# Patient Record
Sex: Male | Born: 1975 | Race: White | Hispanic: No | Marital: Married | Smoking: Current every day smoker
Health system: Southern US, Community
[De-identification: ages and names within clinical notes are randomized; demographics above are authoritative.]

## PROBLEM LIST (undated history)

## (undated) DIAGNOSIS — M795 Residual foreign body in soft tissue: Secondary | ICD-10-CM

## (undated) DIAGNOSIS — Z9884 Bariatric surgery status: Secondary | ICD-10-CM

## (undated) DIAGNOSIS — F4329 Adjustment disorder with other symptoms: Secondary | ICD-10-CM

## (undated) DIAGNOSIS — E119 Type 2 diabetes mellitus without complications: Secondary | ICD-10-CM

## (undated) DIAGNOSIS — K912 Postsurgical malabsorption, not elsewhere classified: Secondary | ICD-10-CM

## (undated) DIAGNOSIS — F32A Depression, unspecified: Secondary | ICD-10-CM

## (undated) DIAGNOSIS — G629 Polyneuropathy, unspecified: Secondary | ICD-10-CM

## (undated) DIAGNOSIS — J449 Chronic obstructive pulmonary disease, unspecified: Secondary | ICD-10-CM

## (undated) DIAGNOSIS — Z903 Acquired absence of stomach [part of]: Secondary | ICD-10-CM

## (undated) DIAGNOSIS — J309 Allergic rhinitis, unspecified: Secondary | ICD-10-CM

## (undated) DIAGNOSIS — K219 Gastro-esophageal reflux disease without esophagitis: Secondary | ICD-10-CM

## (undated) HISTORY — PX: CHOLECYSTECTOMY: SHX55

## (undated) HISTORY — PX: BARIATRIC SURGERY: SHX1103

## (undated) HISTORY — PX: GASTRIC BYPASS: SHX52

---

## 2012-12-02 ENCOUNTER — Emergency Department: Admission: EM | Admit: 2012-12-02 | Discharge: 2012-12-02 | Payer: Self-pay

## 2013-08-24 ENCOUNTER — Ambulatory Visit (INDEPENDENT_AMBULATORY_CARE_PROVIDER_SITE_OTHER): Payer: BC Managed Care – PPO

## 2013-08-24 DIAGNOSIS — M546 Pain in thoracic spine: Secondary | ICD-10-CM

## 2013-08-24 DIAGNOSIS — R293 Abnormal posture: Secondary | ICD-10-CM

## 2013-08-24 DIAGNOSIS — M6281 Muscle weakness (generalized): Secondary | ICD-10-CM

## 2013-09-03 ENCOUNTER — Encounter: Payer: BC Managed Care – PPO | Admitting: Physical Therapy

## 2016-01-24 ENCOUNTER — Encounter: Payer: Self-pay | Admitting: Emergency Medicine

## 2016-01-24 ENCOUNTER — Emergency Department (INDEPENDENT_AMBULATORY_CARE_PROVIDER_SITE_OTHER)
Admission: EM | Admit: 2016-01-24 | Discharge: 2016-01-24 | Disposition: A | Discharge status: Home / Self Care | Payer: BLUE CROSS/BLUE SHIELD | Source: Home / Self Care | Attending: Family Medicine | Admitting: Family Medicine

## 2016-01-24 DIAGNOSIS — M545 Low back pain, unspecified: Secondary | ICD-10-CM

## 2016-01-24 HISTORY — DX: Type 2 diabetes mellitus without complications: E11.9

## 2016-01-24 MED ORDER — CYCLOBENZAPRINE HCL 10 MG PO TABS: 10.0000 mg | ORAL_TABLET | Freq: Two times a day (BID) | ORAL | Status: DC | PRN

## 2016-01-24 MED ORDER — HYDROCODONE-ACETAMINOPHEN 5-325 MG PO TABS: 2.0000 | ORAL_TABLET | ORAL | Status: DC | PRN

## 2016-01-24 NOTE — ED Notes (Signed)
Pt c/o mid to low back pain x 1 week.  No apparent injury.

## 2016-01-24 NOTE — ED Provider Notes (Signed)
CSN: [id number]     Arrival date & time 01/24/16  1357 History   None    Chief Complaint  Patient presents with  . Back Pain   (Consider location/radiation/quality/duration/timing/severity/associated sxs/prior Treatment) HPI  Christian Mathis is a 40 y.o. male presenting to UC with c/o Left sided mid-lower back pain that started about 1 week ago. Pain is aching and sore, waxes and wanes, worse with certain movements.  Pain is 8/10 at worst. He has hx of gastric bypass surgery so he cannot have ibuprofen but he has tried acetaminophen and naproxen with minimal relief. No known injury. He use to have back problems before he lost a lot of weight from the surgery but has not had any pain until this past week. No hx of back surgeries. Pain does not radiate. Denies pain or numbness in arms or legs.    Past Medical History  Diagnosis Date  . Diabetes mellitus without complication [facility](HCC)    Past Surgical History  Procedure Laterality Date  . Gastric bypass    . Cholecystectomy     No family history on file. Social History  Substance Use Topics  . Smoking status: Never Smoker   . Smokeless tobacco: None  . Alcohol Use: 1.8 oz/week    2 Cans of beer, 1 Glasses of wine per week    Review of Systems  Constitutional: Negative for fever and chills.  Genitourinary: Negative for dysuria, frequency, hematuria and flank pain.  Musculoskeletal: Positive for myalgias and back pain. Negative for joint swelling, arthralgias, gait problem, neck pain and neck stiffness.  Skin: Negative for color change and rash.  Neurological: Negative for weakness and numbness.    Allergies  Morphine and related  Home Medications   Prior to Admission medications   Medication Sig Start Date End Date Taking? Authorizing Provider  omeprazole (PRILOSEC) 10 MG capsule Take 10 mg by mouth daily.   Yes Historical Provider, MD  testosterone cypionate (DEPOTESTOTERONE CYPIONATE) 100 MG/ML injection Inject into the muscle  every 14 (fourteen) days. For IM use only   Yes Historical Provider, MD  cyclobenzaprine (FLEXERIL) 10 MG tablet Take 1 tablet (10 mg total) by mouth 2 (two) times daily as needed for muscle spasms. 01/24/16   Junius FinnerErin O'Malley, PA-C  HYDROcodone-acetaminophen (NORCO/VICODIN) 5-325 MG tablet Take 2 tablets by mouth every 4 (four) hours as needed. 01/24/16   Junius FinnerErin O'Malley, PA-C   Meds Ordered and Administered this Visit  Medications - No data to display  BP 115/79 mmHg  Pulse 97  Temp(Src) 98 F (36.7 C) (Oral)  Ht 5\' 9"  (1.753 m)  Wt 189 lb 8 oz (85.957 kg)  BMI 27.97 kg/m2  SpO2 97% No data found.   Physical Exam  Constitutional: He is oriented to person, place, and time. He appears well-developed and well-nourished.  HENT:  Head: Normocephalic and atraumatic.  Eyes: EOM are normal.  Neck: Normal range of motion. Neck supple.  No midline bone tenderness, no crepitus or step-offs.   Cardiovascular: Normal rate.   Pulmonary/Chest: Effort normal.  Musculoskeletal: Normal range of motion. He exhibits tenderness. He exhibits no edema.  No midline spinal tenderness.  Full ROM upper and lower extremities bilaterally. Tenderness to Left side lumbar muscles.   Neurological: He is alert and oriented to person, place, and time.  Skin: Skin is warm and dry. No rash noted. No erythema.  Psychiatric: He has a normal mood and affect. His behavior is normal.  Nursing note and vitals reviewed.  ED Course  Procedures (including critical care time)  Labs Review Labs Reviewed - No data to display  Imaging Review No results found.    MDM   1. Left-sided low back pain without sciatica    Pt c/o Left lower back pain. Tenderness to Left side lumbar muscles.  No known injury. No red flag symptoms. No indication for imaging at this time. Will treat as muscle strain.  Rx: Norco and flexeril.   Encouraged alternating ice and heat. Home care instructions with exercises provided. Encouraged f/u  with Sports Medicine in 1-2 weeks if not improving, sooner if worsening.  Patient verbalized understanding and agreement with treatment plan.     Junius Finnerrin O'Malley, PA-C 01/24/16 1452

## 2016-01-24 NOTE — Discharge Instructions (Signed)
°  Norco/Vicodin (hydrocodone-acetaminophen) is a narcotic pain medication, do not combine these medications with others containing tylenol. While taking, do not drink alcohol, drive, or perform any other activities that requires focus while taking these medications.  ° °Flexeril is a muscle relaxer and may cause drowsiness. Do not drink alcohol, drive, or operate heavy machinery while taking. ° °

## 2016-06-23 ENCOUNTER — Emergency Department (INDEPENDENT_AMBULATORY_CARE_PROVIDER_SITE_OTHER)
Admission: EM | Admit: 2016-06-23 | Discharge: 2016-06-23 | Disposition: A | Discharge status: Home / Self Care | Payer: BLUE CROSS/BLUE SHIELD | Source: Home / Self Care

## 2016-06-23 ENCOUNTER — Encounter: Payer: Self-pay | Admitting: Emergency Medicine

## 2016-06-23 DIAGNOSIS — M7712 Lateral epicondylitis, left elbow: Secondary | ICD-10-CM

## 2016-06-23 NOTE — ED Provider Notes (Signed)
[facility]    CSN: [id number] Arrival date & time: 06/23/16  1233     History   Chief Complaint Chief Complaint  Patient presents with  . Elbow Pain    HPI Christian Mathis is a 40 y.o. male.   Patient complains of 4 month history of pain in his left elbow that has become worse during the past week, occasionally radiating to his left thumb.  He recalls no injury.  He notes that Aleve helps somewhat.   The history is provided by the patient.    Past Medical History:  Diagnosis Date  . Diabetes mellitus without complication (HCC)     There are no active problems to display for this patient.   Past Surgical History:  Procedure Laterality Date  . CHOLECYSTECTOMY    . GASTRIC BYPASS         Home Medications    Prior to Admission medications   Medication Sig Start Date End Date Taking? Authorizing Provider  cyclobenzaprine (FLEXERIL) 10 MG tablet Take 1 tablet (10 mg total) by mouth 2 (two) times daily as needed for muscle spasms. 01/24/16   Junius FinnerErin O'Malley, PA-C  HYDROcodone-acetaminophen (NORCO/VICODIN) 5-325 MG tablet Take 2 tablets by mouth every 4 (four) hours as needed. 01/24/16   Junius FinnerErin O'Malley, PA-C  omeprazole (PRILOSEC) 10 MG capsule Take 10 mg by mouth daily.    Historical Provider, MD  testosterone cypionate (DEPOTESTOTERONE CYPIONATE) 100 MG/ML injection Inject into the muscle every 14 (fourteen) days. For IM use only    Historical Provider, MD    Family History No family history on file.  Social History Social History  Substance Use Topics  . Smoking status: Current Every Day Smoker    Years: 25.00    Types: Cigarettes  . Smokeless tobacco: Never Used     Comment: Is taking something to help quit but doesn't remember what  . Alcohol use 1.8 oz/week    1 Glasses of wine, 2 Cans of beer per week     Allergies   Morphine and related   Review of Systems Review of Systems  Musculoskeletal: Negative for joint swelling.       Left elbow  pain.  Skin: Negative.   All other systems reviewed and are negative.    Physical Exam Triage Vital Signs ED Triage Vitals  Enc Vitals Group     BP 06/23/16 1326 110/74     Pulse Rate 06/23/16 1326 84     Resp --      Temp 06/23/16 1326 97.9 F (36.6 C)     Temp Source 06/23/16 1326 Oral     SpO2 06/23/16 1326 97 %     Weight 06/23/16 1328 172 lb (78 kg)     Height 06/23/16 1328 5\' 9"  (1.753 m)     Head Circumference --      Peak Flow --      Pain Score 06/23/16 1331 7     Pain Loc --      Pain Edu? --      Excl. in GC? --    No data found.   Updated Vital Signs BP 110/74 (BP Location: Left Arm)   Pulse 84   Temp 97.9 F (36.6 C) (Oral)   Ht 5\' 9"  (1.753 m)   Wt 172 lb (78 kg)   SpO2 97%   BMI 25.40 kg/m   Visual Acuity Right Eye Distance:   Left Eye Distance:   Bilateral Distance:  Right Eye Near:   Left Eye Near:    Bilateral Near:     Physical Exam  Constitutional: He appears well-developed and well-nourished. No distress.  HENT:  Head: Atraumatic.  Eyes: Pupils are equal, round, and reactive to light.  Neck: Normal range of motion.  Cardiovascular: Normal rate.   Pulmonary/Chest: Effort normal.  Musculoskeletal: He exhibits no edema or deformity.       Left elbow: He exhibits normal range of motion and no swelling. Tenderness found. Lateral epicondyle tenderness noted.       Arms: There is distinct tenderness over the left lateral epicondyle.  Palpation there during resisted dorsiflexion and supination of the wrist recreates his pain.   Neurological: He is alert.  Nursing note and vitals reviewed.    UC Treatments / Results  Labs (all labs ordered are listed, but only abnormal results are displayed) Labs Reviewed - No data to display  EKG  EKG Interpretation None       Radiology No results found.  Procedures Procedures (including critical care time)  Medications Ordered in UC Medications - No data to display   Initial  Impression / Assessment and Plan / UC Course  I have reviewed the triage vital signs and the nursing notes.  Pertinent labs & imaging results that were available during my care of the patient were reviewed by me and considered in my medical decision making (see chart for details).  Clinical Course   AirCast elbow brace applied. Wear elbow brace daytime.  Take Aleve, 2 tabs twice daily with food for 5 to 7 days.  Apply ice pack for 20 to 30 minutes, 3 to 4 times daily  Continue until pain and swelling decrease.  Begin range of motion and stretching exercises as per instruction sheet. Followup with Dr. Rodney Langtonhomas Thekkekandam or Dr. Clementeen GrahamEvan Corey ([facility]) if not improving about two weeks.      Final Clinical Impressions(s) / UC Diagnoses   Final diagnoses:  Left lateral epicondylitis    New Prescriptions New Prescriptions   No medications on file     Christian HawStephen A Amen Dargis, MD 07/01/16 [phone]

## 2016-06-23 NOTE — ED Triage Notes (Signed)
Left elbow pain x 2 months worse the past week, sometimes radiates to thumb, denies trauma

## 2016-06-23 NOTE — Discharge Instructions (Signed)
Wear elbow brace daytime.  Take Aleve, 2 tabs twice daily with food for 5 to 7 days.  Apply ice pack for 20 to 30 minutes, 3 to 4 times daily  Continue until pain and swelling decrease.  Begin range of motion and stretching exercises as per instruction sheet.

## 2017-01-31 ENCOUNTER — Emergency Department (INDEPENDENT_AMBULATORY_CARE_PROVIDER_SITE_OTHER): Payer: BLUE CROSS/BLUE SHIELD

## 2017-01-31 ENCOUNTER — Encounter: Payer: Self-pay | Admitting: *Deleted

## 2017-01-31 ENCOUNTER — Emergency Department (INDEPENDENT_AMBULATORY_CARE_PROVIDER_SITE_OTHER)
Admission: EM | Admit: 2017-01-31 | Discharge: 2017-01-31 | Disposition: A | Discharge status: Home / Self Care | Payer: BLUE CROSS/BLUE SHIELD | Source: Home / Self Care | Attending: Family Medicine | Admitting: Family Medicine

## 2017-01-31 DIAGNOSIS — S62337A Displaced fracture of neck of fifth metacarpal bone, left hand, initial encounter for closed fracture: Secondary | ICD-10-CM

## 2017-01-31 DIAGNOSIS — S62337D Displaced fracture of neck of fifth metacarpal bone, left hand, subsequent encounter for fracture with routine healing: Secondary | ICD-10-CM | POA: Diagnosis not present

## 2017-01-31 DIAGNOSIS — X58XXXD Exposure to other specified factors, subsequent encounter: Secondary | ICD-10-CM | POA: Diagnosis not present

## 2017-01-31 DIAGNOSIS — S92352A Displaced fracture of fifth metatarsal bone, left foot, initial encounter for closed fracture: Secondary | ICD-10-CM

## 2017-01-31 DIAGNOSIS — X58XXXA Exposure to other specified factors, initial encounter: Secondary | ICD-10-CM

## 2017-01-31 DIAGNOSIS — S62339A Displaced fracture of neck of unspecified metacarpal bone, initial encounter for closed fracture: Secondary | ICD-10-CM | POA: Diagnosis not present

## 2017-01-31 MED ORDER — HYDROCODONE-ACETAMINOPHEN 10-325 MG PO TABS: 1.0000 | ORAL_TABLET | ORAL | 0 refills | Status: DC | PRN

## 2017-01-31 NOTE — Assessment & Plan Note (Signed)
Closed reduction, ulnar gutter splint. Hydrocodone for pain. Return to see me next week for repeat x-rays.   I billed a fracture code for this encounter, all subsequent visits will be post-op checks in the global period.

## 2017-01-31 NOTE — Discharge Instructions (Signed)
°  Norco/Vicodin (hydrocodone-acetaminophen) is a narcotic pain medication, do not combine these medications with others containing tylenol. While taking, do not drink alcohol, drive, or perform any other activities that requires focus while taking these medications.  ° °

## 2017-01-31 NOTE — Consult Note (Signed)
   Subjective:    I'm seeing this patient as a consultation for:  Jerrel IvoryEric O'Malley PA-C  CC: Hand fracture  HPI: 3 days ago this 41 year old male punched a wall, he had immediate pain, swelling, deformity. He presented to urgent care, x-rays showed an angulated fifth metacarpal boxer's fracture and I was called for further evaluation and definitive treatment, pain is moderate, persistent, localized without radiation.  Past medical history:  Negative.  See flowsheet/record as well for more information.  Surgical history: Negative.  See flowsheet/record as well for more information.  Family history: Negative.  See flowsheet/record as well for more information.  Social history: Negative.  See flowsheet/record as well for more information.  Allergies, and medications have been entered into the medical record, reviewed, and no changes needed.   Review of Systems: No headache, visual changes, nausea, vomiting, diarrhea, constipation, dizziness, abdominal pain, skin rash, fevers, chills, night sweats, weight loss, swollen lymph nodes, body aches, joint swelling, muscle aches, chest pain, shortness of breath, mood changes, visual or auditory hallucinations.   Objective:   General: Well Developed, well nourished, and in no acute distress.  Neuro/Psych: Alert and oriented x3, extra-ocular muscles intact, able to move all 4 extremities, sensation grossly intact. Skin: Warm and dry, no rashes noted.  Respiratory: Not using accessory muscles, speaking in full sentences, trachea midline.  Cardiovascular: Pulses palpable, no extremity edema. Abdomen: Does not appear distended. Left hand: Swollen, bruised, tender at the neck of the fifth metacarpal. Visible deformity.  X-rays reviewed and show an angulated fifth metacarpal fracture, apex dorsal and ulnar.  Procedure:  Fracture Reduction   Risks, benefits, and alternatives explained and consent obtained. Time out conducted. Surface prepped with  alcohol. 5cc lidocaine infiltrated in a hematoma block. Adequate anesthesia ensured. Fracture reduction: I applied a volarly directed force to reduce the fracture. Ulnar gutter splint was then applied. Post reduction films obtained showed anatomic/near-anatomic alignment. Pt stable, aftercare and follow-up advised.  Impression and Recommendations:   This case required medical decision making of moderate complexity.  Boxer's fracture Closed reduction, ulnar gutter splint. Hydrocodone for pain. Return to see me next week for repeat x-rays.   I billed a fracture code for this encounter, all subsequent visits will be post-op checks in the global period.

## 2017-01-31 NOTE — ED Provider Notes (Signed)
CSN: [id number]     Arrival date & time 01/31/17  1258 History   First MD Initiated Contact with Patient 01/31/17 1310     Chief Complaint  Patient presents with  . Hand Injury   (Consider location/radiation/quality/duration/timing/severity/associated sxs/prior Treatment) HPI  Christian Mathis is a 41 y.o. male presenting to UC with c/o Left hand pain and swelling after punching a wall 3 days ago.  Pain is aching and sore, worse with movement and palpation.  Pain is worst over 5th metacarpal. No other injuries. Pt denies hitting another person's mouth. He is Left hand dominant. He [facility] at [facility] this morning.    Past Medical History:  Diagnosis Date  . Diabetes mellitus without complication [facility])    Past Surgical History:  Procedure Laterality Date  . CHOLECYSTECTOMY    . GASTRIC BYPASS     History reviewed. No pertinent family history. Social History  Substance Use Topics  . Smoking status: Current Every Day Smoker    Packs/day: 1.00    Years: 25.00    Types: Cigarettes  . Smokeless tobacco: Never Used     Comment: Is taking something to help quit but doesn't remember what  . Alcohol use 1.8 oz/week    1 Glasses of wine, 2 Cans of beer per week    Review of Systems  Musculoskeletal: Positive for arthralgias, joint swelling and myalgias.  Skin: Negative for color change and wound.  Neurological: Negative for weakness and numbness.    Allergies  Morphine and related  Home Medications   Prior to Admission medications   Medication Sig Start Date End Date Taking? Authorizing Provider  metFORMIN (GLUCOPHAGE) 500 MG tablet Take by mouth 2 (two) times daily with a meal.   Yes [provider]  HYDROcodone-acetaminophen (NORCO) 10-325 MG tablet Take 1 tablet by mouth every 4 (four) hours as needed. 01/31/17   Monica Becton, MD  omeprazole (PRILOSEC) 10 MG capsule Take 10 mg by mouth daily.    [provider]  testosterone cypionate (DEPOTESTOTERONE  CYPIONATE) 100 MG/ML injection Inject into the muscle every 14 (fourteen) days. For IM use only    [provider]   Meds Ordered and Administered this Visit  Medications - No data to display  BP 131/76 (BP Location: Left Arm)   Pulse 68   Temp 98.2 F (36.8 C) (Oral)   Resp 16   Ht 5\' 9"  (1.753 m)   Wt 169 lb (76.7 kg)   SpO2 99%   BMI 24.96 kg/m  No data found.   Physical Exam  Constitutional: He is oriented to person, place, and time. He appears well-developed and well-nourished. No distress.  HENT:  Head: Normocephalic and atraumatic.  Eyes: EOM are normal.  Neck: Normal range of motion.  Cardiovascular: Normal rate.   Pulmonary/Chest: Effort normal.  Musculoskeletal: Normal range of motion. He exhibits edema and tenderness.  Left hand: mild edema to dorsal lateral aspect. Tenderness over 5th MCP joint. Full ROM wrist and all fingers. Able to make a full fist.   Neurological: He is alert and oriented to person, place, and time.  Left hand: normal sensation in all fingers  Skin: Skin is warm and dry. Capillary refill takes less than 2 seconds. He is not diaphoretic.  Left hand: 2mm superficial abrasion with dried skin radial side of 5th MCP joint (chronic per pt) skin otherwise in tact. No ecchymosis or erythema.   Psychiatric: He has a normal mood and affect. His behavior  is normal.  Nursing note and vitals reviewed.   Urgent Care Course     Procedures (including critical care time)  Labs Review Labs Reviewed - No data to display  Imaging Review Dg Hand Complete Left  Result Date: 01/31/2017 CLINICAL DATA:  Reduction of the left fifth metacarpal fracture. EXAM: LEFT HAND - COMPLETE 3+ VIEW COMPARISON:  January 31, 2017 1:41 p.m. FINDINGS: Patient status post reduction of fracture of distal fifth metacarpal with interval significant decrease of previously noted ventral angulation. Minimal displacement without significant angulation is identified on the current  exam. Cystic structure in the scaphoid is unchanged. IMPRESSION: Status post reduction of fracture of metacarpal with minimal displacement and no significant angulation identified on current films. Electronically Signed   By: Sherian ReinWei-Chen  Lin M.D.   On: 01/31/2017 14:24   Dg Hand Complete Left  Result Date: 01/31/2017 CLINICAL DATA:  Punched a wall on Friday. Fifth metacarpal pain. Initial encounter. EXAM: LEFT HAND - COMPLETE 3+ VIEW COMPARISON:  None. FINDINGS: Fifth metacarpal neck fracture with ventral impaction causing approximately 50 degrees of angulation. No additional fracture. No dislocation. Incidental cystic structure in the scaphoid.  No articular erosions. IMPRESSION: Impacted fifth metacarpal neck fracture. Electronically Signed   By: Marnee SpringJonathon  Watts M.D.   On: 01/31/2017 13:28     MDM   1. Closed boxer's fracture, initial encounter   2. Closed displaced fracture of neck of fifth metacarpal bone of left hand, initial encounter     Left hand: closed, impacted fifth metacarpal neck fracture. Discussed treatment options and f/u with pt. Pt agreeable to have Dr. Benjamin Stainhekkekandam, Sports Medicine, come over today to reduce and splint hand fracture  See consult note by Dr. Benjamin Stainhekkekandam Fracture was reduced, pt placed in splint Rx: Norco  F/u with Sports medicine in 1 week.      Lurene Shadowhelps, Cameryn Chrisley O, PA-C 01/31/17 1430

## 2017-01-31 NOTE — ED Triage Notes (Signed)
Pt c/o LT hand pain after punching a wall 3 days ago. Last dose aleve at 0500 today.

## 2017-04-12 ENCOUNTER — Encounter: Payer: Self-pay | Admitting: *Deleted

## 2017-04-12 ENCOUNTER — Emergency Department (INDEPENDENT_AMBULATORY_CARE_PROVIDER_SITE_OTHER)
Admission: EM | Admit: 2017-04-12 | Discharge: 2017-04-12 | Disposition: A | Discharge status: Home / Self Care | Payer: BLUE CROSS/BLUE SHIELD | Source: Home / Self Care | Attending: Family Medicine | Admitting: Family Medicine

## 2017-04-12 DIAGNOSIS — M545 Low back pain, unspecified: Secondary | ICD-10-CM

## 2017-04-12 MED ORDER — KETOROLAC TROMETHAMINE 60 MG/2ML IM SOLN
60.0000 mg | Freq: Once | INTRAMUSCULAR | Status: AC
  Administered 2017-04-12: 60 mg via INTRAMUSCULAR

## 2017-04-12 MED ORDER — PREDNISONE 20 MG PO TABS: ORAL_TABLET | ORAL | 0 refills | Status: DC

## 2017-04-12 MED ORDER — METHYLPREDNISOLONE ACETATE 80 MG/ML IJ SUSP
80.0000 mg | Freq: Once | INTRAMUSCULAR | Status: AC
  Administered 2017-04-12: 80 mg via INTRAMUSCULAR

## 2017-04-12 MED ORDER — METHOCARBAMOL 500 MG PO TABS: 500.0000 mg | ORAL_TABLET | Freq: Two times a day (BID) | ORAL | 0 refills | Status: DC | PRN

## 2017-04-12 MED ORDER — MELOXICAM 15 MG PO TABS: 15.0000 mg | ORAL_TABLET | Freq: Every day | ORAL | 0 refills | Status: DC

## 2017-04-12 NOTE — ED Triage Notes (Signed)
Pt c/o LBP x 1 wk, worse x 1 day. Denies injury. Took Aleve earlier this week with some relief. Took Tramadol yesterday without relief.

## 2017-04-12 NOTE — Discharge Instructions (Signed)
°  You were given a shot of depomedrol (a steroid) today to help with inflammation and pain in your back.  You have been prescribed 5 days of prednisone, an oral steroid.  You may start this medication tomorrow with breakfast.    Meloxicam (Mobic) is an antiinflammatory to help with pain and inflammation.  Do not take ibuprofen, Advil, Aleve, or any other medications that contain NSAIDs while taking meloxicam as this may cause stomach upset or even ulcers if taken in large amounts for an extended period of time.   Robaxin (methocarbamol) is a muscle relaxer and may cause drowsiness. Do not drink alcohol, drive, or operate heavy machinery while taking.

## 2017-04-12 NOTE — ED Provider Notes (Signed)
[facility]    CSN: [id number] Arrival date & time: 04/12/17  4098     History   Chief Complaint Chief Complaint  Patient presents with  . Back Pain    HPI BRENDIN SITU is a 41 y.o. male.   HPI THOMES BURAK is a 41 y.o. male presenting to UC with c/o 1 week of gradually worsening midline lower back pain that worsened over the last 24 hours. Pain is aching and sore, worse with position changes. Denies known injury. No heavy lifting or falls.  He took Aleve earlier this week with some relief. He took Tramadol, prescribed by his PCP last week w/o relief.  Denies radiation of pain or numbness in groin or legs.  No change in bowel or bladder habits. Hx of lower back pain in the past but no surgeries.    Past Medical History:  Diagnosis Date  . Diabetes mellitus without complication [facility])     Patient Active Problem List   Diagnosis Date Noted  . Boxer's fracture 01/31/2017    Past Surgical History:  Procedure Laterality Date  . CHOLECYSTECTOMY    . GASTRIC BYPASS         Home Medications    Prior to Admission medications   Medication Sig Start Date End Date Taking? Authorizing Provider  metFORMIN (GLUCOPHAGE) 500 MG tablet Take by mouth 2 (two) times daily with a meal.   Yes [provider]  HYDROcodone-acetaminophen (NORCO) 10-325 MG tablet Take 1 tablet by mouth every 4 (four) hours as needed. 01/31/17   Monica Becton, MD  meloxicam (MOBIC) 15 MG tablet Take 1 tablet (15 mg total) by mouth daily. For at least 5-7 days, then daily as needed for pain 04/12/17   Lurene Shadow, PA-C  methocarbamol (ROBAXIN) 500 MG tablet Take 1 tablet (500 mg total) by mouth 2 (two) times daily as needed for muscle spasms. 04/12/17   Lurene Shadow, PA-C  omeprazole (PRILOSEC) 10 MG capsule Take 10 mg by mouth daily.    [provider]  predniSONE (DELTASONE) 20 MG tablet 3 tabs po day one, then 2 po daily x 4 days 04/12/17   Lurene Shadow, PA-C    testosterone cypionate (DEPOTESTOTERONE CYPIONATE) 100 MG/ML injection Inject into the muscle every 14 (fourteen) days. For IM use only    [provider]    Family History History reviewed. No pertinent family history.  Social History Social History  Substance Use Topics  . Smoking status: Current Every Day Smoker    Packs/day: 1.00    Years: 25.00    Types: Cigarettes  . Smokeless tobacco: Never Used     Comment: Is taking something to help quit but doesn't remember what  . Alcohol use 1.8 oz/week    1 Glasses of wine, 2 Cans of beer per week     Allergies   Morphine and related   Review of Systems Review of Systems  Constitutional: Negative for chills and fever.  Gastrointestinal: Negative for abdominal pain and nausea.  Genitourinary: Negative for dysuria, frequency and hematuria.  Musculoskeletal: Positive for back pain and myalgias. Negative for arthralgias.  Skin: Negative for color change and wound.     Physical Exam Triage Vital Signs ED Triage Vitals [04/12/17 1001]  Enc Vitals Group     BP 123/85     Pulse Rate 85     Resp 16     Temp 97.8 F (36.6 C)  Temp Source Oral     SpO2 100 %     Weight 171 lb (77.6 kg)     Height      Head Circumference      Peak Flow      Pain Score 9     Pain Loc      Pain Edu?      Excl. in GC?    No data found.   Updated Vital Signs BP 123/85 (BP Location: Left Arm)   Pulse 85   Temp 97.8 F (36.6 C) (Oral)   Resp 16   Wt 171 lb (77.6 kg)   SpO2 100%   BMI 25.25 kg/m   Visual Acuity Right Eye Distance:   Left Eye Distance:   Bilateral Distance:    Right Eye Near:   Left Eye Near:    Bilateral Near:     Physical Exam  Constitutional: He is oriented to person, place, and time. He appears well-developed and well-nourished. No distress.  HENT:  Head: Normocephalic and atraumatic.  Eyes: EOM are normal.  Neck: Normal range of motion.  Cardiovascular: Normal rate and regular rhythm.    Pulmonary/Chest: Effort normal and breath sounds normal. No respiratory distress.  Musculoskeletal: Normal range of motion. He exhibits tenderness. He exhibits no edema.  Tenderness to lower lumbar spine and SI joint, bilateral lumbar muscular tenderness.  Full ROM upper and lower extremities with 5/5 strength. Negative straight leg raise. Slow with position changes (lying to sitting) but normal gait.   Neurological: He is alert and oriented to person, place, and time.  Skin: Skin is warm and dry. Capillary refill takes less than 2 seconds. He is not diaphoretic.  Psychiatric: He has a normal mood and affect. His behavior is normal.  Nursing note and vitals reviewed.    UC Treatments / Results  Labs (all labs ordered are listed, but only abnormal results are displayed) Labs Reviewed - No data to display  EKG  EKG Interpretation None       Radiology No results found.  Procedures Procedures (including critical care time)  Medications Ordered in UC Medications  ketorolac (TORADOL) injection 60 mg (60 mg Intramuscular Given 04/12/17 1020)  methylPREDNISolone acetate (DEPO-MEDROL) injection 80 mg (80 mg Intramuscular Given 04/12/17 1020)     Initial Impression / Assessment and Plan / UC Course  I have reviewed the triage vital signs and the nursing notes.  Pertinent labs & imaging results that were available during my care of the patient were reviewed by me and considered in my medical decision making (see chart for details).     Pain likely due to muscle strain. No red flag symptoms. Hx of lower back pain in the past.  Will treat conservatively at this time. Encouraged f/u with PCP or Sports Medicine in 1 week if not improving.   Final Clinical Impressions(s) / UC Diagnoses   Final diagnoses:  Acute midline low back pain without sciatica    New Prescriptions Discharge Medication List as of 04/12/2017 10:21 AM    START taking these medications   Details   meloxicam (MOBIC) 15 MG tablet Take 1 tablet (15 mg total) by mouth daily. For at least 5-7 days, then daily as needed for pain, Starting Tue 04/12/2017, Normal    methocarbamol (ROBAXIN) 500 MG tablet Take 1 tablet (500 mg total) by mouth 2 (two) times daily as needed for muscle spasms., Starting Tue 04/12/2017, Normal    predniSONE (DELTASONE) 20 MG tablet 3 tabs  po day one, then 2 po daily x 4 days, Normal         Controlled Substance Prescriptions [place] Controlled Substance Registry consulted? Not Applicable   Rolla Plate 04/12/17 1201

## 2018-01-23 ENCOUNTER — Encounter: Payer: Self-pay | Admitting: Emergency Medicine

## 2018-01-23 ENCOUNTER — Emergency Department (INDEPENDENT_AMBULATORY_CARE_PROVIDER_SITE_OTHER)
Admission: EM | Admit: 2018-01-23 | Discharge: 2018-01-23 | Disposition: A | Discharge status: Home / Self Care | Payer: BLUE CROSS/BLUE SHIELD | Source: Home / Self Care

## 2018-01-23 DIAGNOSIS — N342 Other urethritis: Secondary | ICD-10-CM | POA: Diagnosis not present

## 2018-01-23 DIAGNOSIS — Z711 Person with feared health complaint in whom no diagnosis is made: Secondary | ICD-10-CM | POA: Diagnosis not present

## 2018-01-23 MED ORDER — AZITHROMYCIN 250 MG PO TABS: ORAL_TABLET | ORAL | 0 refills | Status: DC

## 2018-01-23 MED ORDER — CEFTRIAXONE SODIUM 250 MG IJ SOLR
250.0000 mg | Freq: Once | INTRAMUSCULAR | Status: AC
  Administered 2018-01-23: 250 mg via INTRAMUSCULAR

## 2018-01-23 NOTE — ED Triage Notes (Signed)
passcode is [id number]

## 2018-01-23 NOTE — ED Triage Notes (Signed)
Pt c/o sore on penis since Friday and d/c. He would like STD testing.

## 2018-01-23 NOTE — Discharge Instructions (Addendum)
Drink lots of fluids to keep urinating regularly.  No sex until everything is cleared up  No sex until partner has been treated  Return if needed

## 2018-01-23 NOTE — ED Provider Notes (Signed)
[facility]    CSN: [id number] Arrival date & time: 01/23/18  1040     History   Chief Complaint Chief Complaint  Patient presents with  . Penile Discharge    HPI Christian Mathis is a 42 y.o. male.   HPI Patient separated from his wife 1 year ago after she had been cheating on him for a year.  First he was tested, and later his wife was tested, and neither had STDs.  Since last September he has been with a another girl.  2 days ago he started getting some irritation of his penis.  He does have a history of kidney stones, so he thought it might be that.  However yesterday noticed pus coming from his penis and knew that it was an infection.  He came on in today to get checked.  He does not know if his girlfriend having any symptoms. Past Medical History:  Diagnosis Date  . Diabetes mellitus without complication [facility](HCC)     Patient Active Problem List   Diagnosis Date Noted  . Boxer's fracture 01/31/2017    Past Surgical History:  Procedure Laterality Date  . CHOLECYSTECTOMY    . GASTRIC BYPASS         Home Medications    Prior to Admission medications   Medication Sig Start Date End Date Taking? Authorizing Provider  azithromycin (ZITHROMAX) 250 MG tablet 4 pills orally at the same time today. 01/23/18   Peyton NajjarHopper, Moreen Piggott H, MD  HYDROcodone-acetaminophen (NORCO) 10-325 MG tablet Take 1 tablet by mouth every 4 (four) hours as needed. 01/31/17   Monica Bectonhekkekandam, Thomas J, MD  meloxicam (MOBIC) 15 MG tablet Take 1 tablet (15 mg total) by mouth daily. For at least 5-7 days, then daily as needed for pain 04/12/17   Lurene ShadowPhelps, Erin O, PA-C  metFORMIN (GLUCOPHAGE) 500 MG tablet Take by mouth 2 (two) times daily with a meal.    [provider]  methocarbamol (ROBAXIN) 500 MG tablet Take 1 tablet (500 mg total) by mouth 2 (two) times daily as needed for muscle spasms. 04/12/17   Lurene ShadowPhelps, Erin O, PA-C  omeprazole (PRILOSEC) 10 MG capsule Take 10 mg by mouth daily.    [provider]  predniSONE (DELTASONE) 20 MG tablet 3 tabs po day one, then 2 po daily x 4 days 04/12/17   Lurene ShadowPhelps, Erin O, PA-C  testosterone cypionate (DEPOTESTOTERONE CYPIONATE) 100 MG/ML injection Inject into the muscle every 14 (fourteen) days. For IM use only    [provider]    Family History History reviewed. No pertinent family history.  Social History Social History   Tobacco Use  . Smoking status: Current Every Day Smoker    Packs/day: 1.00    Years: 25.00    Pack years: 25.00    Types: Cigarettes  . Smokeless tobacco: Never Used  . Tobacco comment: Is taking something to help quit but doesn't remember what  Substance Use Topics  . Alcohol use: Yes    Alcohol/week: 1.8 oz    Types: 1 Glasses of wine, 2 Cans of beer per week  . Drug use: No     Allergies   Morphine and related   Review of Systems Review of Systems He has had a little fatigue over the last month but he attributes that to stress at work.  Physical Exam Triage Vital Signs ED Triage Vitals  Enc Vitals Group     BP 01/23/18 1124 121/80     Pulse  Rate 01/23/18 1124 82     Resp --      Temp 01/23/18 1124 98.2 F (36.8 C)     Temp Source 01/23/18 1124 Oral     SpO2 01/23/18 1124 99 %     Weight 01/23/18 1125 172 lb (78 kg)     Height --      Head Circumference --      Peak Flow --      Pain Score 01/23/18 1125 0     Pain Loc --      Pain Edu? --      Excl. in GC? --    No data found.  Updated Vital Signs BP 121/80 (BP Location: Right Arm)   Pulse 82   Temp 98.2 F (36.8 C) (Oral)   Wt 172 lb (78 kg)   SpO2 99%   BMI 25.40 kg/m   Visual Acuity Right Eye Distance:   Left Eye Distance:   Bilateral Distance:    Right Eye Near:   Left Eye Near:    Bilateral Near:     Physical Exam No major acute distress.  He has pus that can be expressed from his penis.  Otherwise normal male external genitalia.  UC Treatments / Results  Labs (all labs ordered are listed, but  only abnormal results are displayed) Labs Reviewed  C. TRACHOMATIS/N. GONORRHOEAE RNA  RPR  HIV ANTIBODY (ROUTINE TESTING)  HSV 1/2 AB (IGM), IFA W/RFLX TITER  HSV(HERPES SIMPLEX VRS) I + II AB-IGG    EKG None  Radiology No results found.  Procedures Procedures (including critical care time)  Medications Ordered in UC Medications  cefTRIAXone (ROCEPHIN) injection 250 mg (has no administration in time range)    Initial Impression / Assessment and Plan / UC Course  I have reviewed the triage vital signs and the nursing notes.  Pertinent labs & imaging results that were available during my care of the patient were reviewed by me and considered in my medical decision making (see chart for details).    Urethritis, presumably gonorrhea or chlamydia, labs are pending.  We will check HIV and RPR and HSV also. Final Clinical Impressions(s) / UC Diagnoses   Final diagnoses:  Concern about STD in male without diagnosis  Urethritis     Discharge Instructions     Drink lots of fluids to keep urinating regularly.  No sex until everything is cleared up  No sex until partner has been treated  Return if needed    ED Prescriptions    Medication Sig Dispense Auth. Provider   azithromycin (ZITHROMAX) 250 MG tablet 4 pills orally at the same time today. 4 tablet Peyton Najjar, MD     Controlled Substance Prescriptions [place] Controlled Substance Registry consulted? No   Peyton Najjar, MD 01/23/18 1243

## 2018-01-24 LAB — C. TRACHOMATIS/N. GONORRHOEAE RNA
C. trachomatis RNA, TMA: NOT DETECTED
N. gonorrhoeae RNA, TMA: NOT DETECTED

## 2018-01-24 LAB — RPR: RPR: NONREACTIVE

## 2018-01-24 LAB — HIV ANTIBODY (ROUTINE TESTING W REFLEX): HIV: NONREACTIVE

## 2018-01-25 LAB — HSV 1/2 AB (IGM), IFA W/RFLX TITER
HSV 1 IgM Screen: NEGATIVE
HSV 2 IgM Screen: NEGATIVE

## 2018-01-25 LAB — HSV(HERPES SIMPLEX VRS) I + II AB-IGG
HAV 1 IGG,TYPE SPECIFIC AB: <0.9 index
HSV 2 IGG,TYPE SPECIFIC AB: <0.9 index

## 2018-01-26 ENCOUNTER — Telehealth: Payer: Self-pay | Admitting: *Deleted

## 2018-01-26 NOTE — Telephone Encounter (Signed)
Password verified and lab results given. He reports that he is feeling better.

## 2018-02-02 ENCOUNTER — Encounter: Payer: Self-pay | Admitting: Radiology

## 2018-12-17 ENCOUNTER — Other Ambulatory Visit: Payer: Self-pay

## 2018-12-17 ENCOUNTER — Emergency Department (INDEPENDENT_AMBULATORY_CARE_PROVIDER_SITE_OTHER): Payer: 59

## 2018-12-17 ENCOUNTER — Emergency Department
Admission: EM | Admit: 2018-12-17 | Discharge: 2018-12-17 | Disposition: A | Discharge status: Home / Self Care | Payer: 59 | Source: Home / Self Care | Attending: Family Medicine | Admitting: Family Medicine

## 2018-12-17 ENCOUNTER — Encounter: Payer: Self-pay | Admitting: Emergency Medicine

## 2018-12-17 DIAGNOSIS — M25511 Pain in right shoulder: Secondary | ICD-10-CM

## 2018-12-17 DIAGNOSIS — M7551 Bursitis of right shoulder: Secondary | ICD-10-CM

## 2018-12-17 DIAGNOSIS — M7521 Bicipital tendinitis, right shoulder: Secondary | ICD-10-CM

## 2018-12-17 MED ORDER — HYDROCODONE-ACETAMINOPHEN 5-325 MG PO TABS: ORAL_TABLET | ORAL | 0 refills | Status: DC

## 2018-12-17 MED ORDER — PREDNISONE 20 MG PO TABS: ORAL_TABLET | ORAL | 0 refills | Status: DC

## 2018-12-17 NOTE — ED Provider Notes (Signed)
[facility]    CSN: [id number] Arrival date & time: 12/17/18  1538     History   Chief Complaint Chief Complaint  Patient presents with   Shoulder Pain    HPI Christian Mathis is a 43 y.o. male.   Patient compalins of pain in his right shoulder for about 6 months, but recalls no injury.  The pain has become worse during the past week, keeping him awake.  The history is provided by the patient.  Shoulder Pain  Location:  Shoulder Shoulder location:  R shoulder Injury: no   Pain details:    Quality:  Aching   Radiates to:  R upper arm   Severity:  Moderate   Onset quality:  Gradual   Duration:  6 months   Timing:  Constant   Progression:  Worsening Handedness:  Left-handed Prior injury to area:  No Relieved by:  Nothing Worsened by:  Movement Ineffective treatments:  None tried Associated symptoms: decreased range of motion   Associated symptoms: no back pain, no muscle weakness, no neck pain, no numbness, no stiffness, no swelling and no tingling     Past Medical History:  Diagnosis Date   Diabetes mellitus without complication [facility](HCC)     Patient Active Problem List   Diagnosis Date Noted   Boxer's fracture 01/31/2017    Past Surgical History:  Procedure Laterality Date   BARIATRIC SURGERY     CHOLECYSTECTOMY     GASTRIC BYPASS         Home Medications    Prior to Admission medications   Medication Sig Start Date End Date Taking? Authorizing Provider  azithromycin (ZITHROMAX) 250 MG tablet 4 pills orally at the same time today. 01/23/18   Peyton NajjarHopper, David H, MD  HYDROcodone-acetaminophen (NORCO/VICODIN) 5-325 MG tablet Take one by mouth at bedtime as needed for pain.  May repeat in 4 to 6 hr prn. 12/17/18   Lattie HawBeese, Mariafernanda Hendricksen A, MD  meloxicam (MOBIC) 15 MG tablet Take 1 tablet (15 mg total) by mouth daily. For at least 5-7 days, then daily as needed for pain 04/12/17   Lurene ShadowPhelps, Erin O, PA-C  metFORMIN (GLUCOPHAGE) 500 MG tablet Take by mouth 2  (two) times daily with a meal.    [provider]  methocarbamol (ROBAXIN) 500 MG tablet Take 1 tablet (500 mg total) by mouth 2 (two) times daily as needed for muscle spasms. 04/12/17   Lurene ShadowPhelps, Erin O, PA-C  omeprazole (PRILOSEC) 10 MG capsule Take 10 mg by mouth daily.    [provider]  predniSONE (DELTASONE) 20 MG tablet Take one tab by mouth twice daily for 4 days, then one daily. Take with food. 12/17/18   Lattie HawBeese, Scotty Pinder A, MD  testosterone cypionate (DEPOTESTOTERONE CYPIONATE) 100 MG/ML injection Inject into the muscle every 14 (fourteen) days. For IM use only    [provider]    Family History No family history on file.  Social History Social History   Tobacco Use   Smoking status: Current Every Day Smoker    Packs/day: 1.00    Years: 25.00    Pack years: 25.00    Types: Cigarettes   Smokeless tobacco: Never Used   Tobacco comment: Is taking something to help quit but doesn't remember what  Substance Use Topics   Alcohol use: Yes    Alcohol/week: 3.0 standard drinks    Types: 1 Glasses of wine, 2 Cans of beer per week   Drug use: No  Allergies   Morphine and related   Review of Systems Review of Systems  Musculoskeletal: Negative for back pain, neck pain and stiffness.  All other systems reviewed and are negative.    Physical Exam Triage Vital Signs ED Triage Vitals  Enc Vitals Group     BP 12/17/18 1600 115/81     Pulse Rate 12/17/18 1600 78     Resp 12/17/18 1600 16     Temp 12/17/18 1600 98.3 F (36.8 C)     Temp Source 12/17/18 1600 Oral     SpO2 12/17/18 1600 98 %     Weight 12/17/18 1602 164 lb (74.4 kg)     Height 12/17/18 1602 5\' 9"  (1.753 m)     Head Circumference --      Peak Flow --      Pain Score 12/17/18 1602 7     Pain Loc --      Pain Edu? --      Excl. in GC? --    No data found.  Updated Vital Signs BP 115/81 (BP Location: Right Arm)    Pulse 78    Temp 98.3 F (36.8 C) (Oral)    Resp 16     Ht 5\' 9"  (1.753 m)    Wt 74.4 kg    SpO2 98%    BMI 24.22 kg/m   Visual Acuity Right Eye Distance:   Left Eye Distance:   Bilateral Distance:    Right Eye Near:   Left Eye Near:    Bilateral Near:     Physical Exam Vitals signs and nursing note reviewed.  Constitutional:      General: He is not in acute distress. HENT:     Head: Normocephalic.     Mouth/Throat:     Pharynx: Oropharynx is clear.  Eyes:     Conjunctiva/sclera: Conjunctivae normal.     Pupils: Pupils are equal, round, and reactive to light.  Neck:     Musculoskeletal: Normal range of motion. No muscular tenderness.  Cardiovascular:     Heart sounds: Normal heart sounds.  Pulmonary:     Breath sounds: Normal breath sounds.  Musculoskeletal:     Right shoulder: He exhibits decreased range of motion and tenderness. He exhibits no bony tenderness, no swelling, no effusion, no crepitus, no deformity and normal strength.       Arms:     Comments:  Right shoulder has good internal/external rotation.  The patient has difficulty actively abducting above the horizontal.  He can achieve 30 degrees above horizontal with passive adduction.  Distinct tenderness over the insertion of biceps tendon and the subacromial bursa.  Empty can test negative.  Hawkins test negative.  Distal neurovascular function is intact.    Skin:    General: Skin is warm and dry.  Neurological:     Mental Status: He is alert.      UC Treatments / Results  Labs (all labs ordered are listed, but only abnormal results are displayed) Labs Reviewed - No data to display  EKG None  Radiology Dg Shoulder Right  Result Date: 12/17/2018 CLINICAL DATA:  RIGHT shoulder pain for 8 months.  No recent injury. EXAM: RIGHT SHOULDER - 2+ VIEW COMPARISON:  None. FINDINGS: There is no evidence of fracture or dislocation. There is no evidence of arthropathy or other focal bone abnormality. Soft tissues are unremarkable. IMPRESSION: Negative. Electronically  Signed   By: Bary Richard M.D.   On: 12/17/2018 16:18  Procedures Procedures (including critical care time)  Medications Ordered in UC Medications - No data to display  Initial Impression / Assessment and Plan / UC Course  I have reviewed the triage vital signs and the nursing notes.  Pertinent labs & imaging results that were available during my care of the patient were reviewed by me and considered in my medical decision making (see chart for details).    Patient has history of gastric bypass; will begin prednisone burst/taper to avoid use of NSAID. Rx for Lortab at bedtime (#10; no refill. He notes that he can take Lortab without adverse effect). Controlled Substance Prescriptions I have consulted the [place] Controlled Substances Registry for this patient, and feel the risk/benefit ratio today is favorable for proceeding with this prescription for a controlled substance.   Followup with Dr. Rodney Langton or Dr. Clementeen Graham ([facility]) if not improving about two weeks.    Final Clinical Impressions(s) / UC Diagnoses   Final diagnoses:  Biceps tendonitis on right  Bursitis of right shoulder     Discharge Instructions     Apply ice pack for 20 to 30 minutes, 3 to 4 times daily  Continue until pain decreases.  Begin range of motion and stretching exercises as tolerated.    ED Prescriptions    Medication Sig Dispense Auth. Provider   predniSONE (DELTASONE) 20 MG tablet Take one tab by mouth twice daily for 4 days, then one daily. Take with food. 12 tablet Lattie Haw, MD   HYDROcodone-acetaminophen (NORCO/VICODIN) 5-325 MG tablet Take one by mouth at bedtime as needed for pain.  May repeat in 4 to 6 hr prn. 10 tablet Lattie Haw, MD        Lattie Haw, MD 12/18/18 [phone]

## 2018-12-17 NOTE — Discharge Instructions (Addendum)
Apply ice pack for 20 to 30 minutes, 3 to 4 times daily  Continue until pain decreases.  °Begin range of motion and stretching exercises as tolerated. °

## 2018-12-17 NOTE — ED Triage Notes (Signed)
Patient reports right shoulder pain for about 6 months with no injury; worsened over past week and he cannot sleep at night due to intermittent pain. No OTC or rx pain  Med today. He has not travelled.

## 2019-01-10 ENCOUNTER — Encounter: Payer: Self-pay | Admitting: Sports Medicine

## 2019-01-10 ENCOUNTER — Ambulatory Visit (INDEPENDENT_AMBULATORY_CARE_PROVIDER_SITE_OTHER): Payer: 59 | Admitting: Sports Medicine

## 2019-01-10 DIAGNOSIS — M19011 Primary osteoarthritis, right shoulder: Secondary | ICD-10-CM | POA: Insufficient documentation

## 2019-01-10 DIAGNOSIS — M75101 Unspecified rotator cuff tear or rupture of right shoulder, not specified as traumatic: Secondary | ICD-10-CM | POA: Diagnosis not present

## 2019-01-10 NOTE — Assessment & Plan Note (Signed)
Subacromial injection. Rehab exercises given, return to see me in 6 weeks.

## 2019-01-10 NOTE — Progress Notes (Signed)
Subjective:    CC: Right shoulder pain  HPI:  This is a pleasant 43 year old male, for the past several months he is had pain in his right shoulder, localized over the deltoid with radiation down to the elbow, worse with overhead activities, abduction, waking him up from sleep at night.  No trauma, no radicular symptoms down to the hand or fingertips.  I reviewed the past medical history, family history, social history, surgical history, and allergies today and no changes were needed.  Please see the problem list section below in epic for further details.  Past Medical History: Past Medical History:  Diagnosis Date  . Diabetes mellitus without complication [facility])    Past Surgical History: Past Surgical History:  Procedure Laterality Date  . BARIATRIC SURGERY    . CHOLECYSTECTOMY    . GASTRIC BYPASS     Social History: Social History   Socioeconomic History  . Marital status: Married    Spouse name: Not on file  . Number of children: Not on file  . Years of education: Not on file  . Highest education level: Not on file  Occupational History  . Not on file  Social Needs  . Financial resource strain: Not on file  . Food insecurity    Worry: Not on file    Inability: Not on file  . Transportation needs    Medical: Not on file    Non-medical: Not on file  Tobacco Use  . Smoking status: Current Every Day Smoker    Packs/day: 1.00    Years: 25.00    Pack years: 25.00    Types: Cigarettes  . Smokeless tobacco: Never Used  . Tobacco comment: Is taking something to help quit but doesn't remember what  Substance and Sexual Activity  . Alcohol use: Yes    Alcohol/week: 3.0 standard drinks    Types: 1 Glasses of wine, 2 Cans of beer per week  . Drug use: No  . Sexual activity: Not on file  Lifestyle  . Physical activity    Days per week: Not on file    Minutes per session: Not on file  . Stress: Not on file  Relationships  . Social Herbalist on phone: Not  on file    Gets together: Not on file    Attends religious service: Not on file    Active member of club or organization: Not on file    Attends meetings of clubs or organizations: Not on file    Relationship status: Not on file  Other Topics Concern  . Not on file  Social History Narrative  . Not on file   Family History: No family history on file. Allergies: Allergies  Allergen Reactions  . Morphine And Related Other (See Comments)    Headache and "burning in throat"  Has had vicodin before w/o issues.   Medications: See med rec.  Review of Systems: No headache, visual changes, nausea, vomiting, diarrhea, constipation, dizziness, abdominal pain, skin rash, fevers, chills, night sweats, swollen lymph nodes, weight loss, chest pain, body aches, joint swelling, muscle aches, shortness of breath, mood changes, visual or auditory hallucinations.  Objective:    General: Well Developed, well nourished, and in no acute distress.  Neuro: Alert and oriented x3, extra-ocular muscles intact, sensation grossly intact.  HEENT: Normocephalic, atraumatic, pupils equal round reactive to light, neck supple, no masses, no lymphadenopathy, thyroid nonpalpable.  Skin: Warm and dry, no rashes noted.  Cardiac: Regular rate and rhythm,  no murmurs rubs or gallops.  Respiratory: Clear to auscultation bilaterally. Not using accessory muscles, speaking in full sentences.  Abdominal: Soft, nontender, nondistended, positive bowel sounds, no masses, no organomegaly.  Right shoulder: Inspection reveals no abnormalities, atrophy or asymmetry. Palpation is normal with no tenderness over AC joint or bicipital groove. ROM is full in all planes. Rotator cuff strength normal throughout. Positive Neer and Hawkin's tests, empty can. Speeds and Yergason's tests normal. No labral pathology noted with negative Obrien's, negative crank, negative clunk, and good stability. Normal scapular function observed. No painful  arc and no drop arm sign. No apprehension sign  Procedure: Real-time Ultrasound Guided injection of the right subacromial bursa Device: GE Logiq E  Verbal informed consent obtained.  Time-out conducted.  Noted no overlying erythema, induration, or other signs of local infection.  Skin prepped in a sterile fashion.  Local anesthesia: Topical Ethyl chloride.  With sterile technique and under real time ultrasound guidance:  Noted a small amount of bursal sided tearing of the supraspinatus, 25-gauge needle advanced into the bursa, I then injected 1 cc Kenalog 40, 1 cc light gain, 1 cc bupivacaine. Completed without difficulty  Pain immediately resolved suggesting accurate placement of the medication.  Advised to call if fevers/chills, erythema, induration, drainage, or persistent bleeding.  Images permanently stored and available for review in the ultrasound unit.  Impression: Technically successful ultrasound guided injection.  Impression and Recommendations:    The patient was counselled, risk factors were discussed, anticipatory guidance given.  Rotator cuff syndrome, right Subacromial injection. Rehab exercises given, return to see me in 6 weeks.   ___________________________________________ Ihor Austinhomas J. Benjamin Stainhekkekandam, M.D., ABFM., CAQSM. Primary Care and Sports Medicine [facility]  Adjunct Professor of Family Medicine  [employer]

## 2019-01-29 ENCOUNTER — Ambulatory Visit: Payer: 59 | Admitting: Sports Medicine

## 2019-01-29 ENCOUNTER — Other Ambulatory Visit: Payer: Self-pay

## 2019-01-29 ENCOUNTER — Encounter: Payer: Self-pay | Admitting: Sports Medicine

## 2019-01-29 DIAGNOSIS — M75101 Unspecified rotator cuff tear or rupture of right shoulder, not specified as traumatic: Secondary | ICD-10-CM | POA: Diagnosis not present

## 2019-01-29 DIAGNOSIS — M25511 Pain in right shoulder: Secondary | ICD-10-CM | POA: Diagnosis not present

## 2019-01-29 DIAGNOSIS — G8929 Other chronic pain: Secondary | ICD-10-CM

## 2019-01-29 MED ORDER — TRAMADOL HCL 50 MG PO TABS: 50.0000 mg | ORAL_TABLET | Freq: Three times a day (TID) | ORAL | 0 refills | Status: DC | PRN
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
## 2019-01-31 ENCOUNTER — Other Ambulatory Visit: Payer: Self-pay

## 2019-01-31 ENCOUNTER — Other Ambulatory Visit: Payer: 59

## 2019-01-31 ENCOUNTER — Ambulatory Visit (INDEPENDENT_AMBULATORY_CARE_PROVIDER_SITE_OTHER): Payer: 59

## 2019-01-31 ENCOUNTER — Ambulatory Visit (INDEPENDENT_AMBULATORY_CARE_PROVIDER_SITE_OTHER): Payer: 59 | Admitting: Sports Medicine

## 2019-01-31 DIAGNOSIS — G8929 Other chronic pain: Secondary | ICD-10-CM | POA: Diagnosis not present

## 2019-01-31 DIAGNOSIS — M75101 Unspecified rotator cuff tear or rupture of right shoulder, not specified as traumatic: Secondary | ICD-10-CM | POA: Diagnosis not present

## 2019-01-31 DIAGNOSIS — M25511 Pain in right shoulder: Secondary | ICD-10-CM | POA: Diagnosis not present

## 2019-01-31 NOTE — Progress Notes (Signed)
   Procedure: Real-time Ultrasound Guided  CT arthrography injection of the right glenohumeral joint Device: GE Logiq E  Verbal informed consent obtained.  Time-out conducted.  Noted no overlying erythema, induration, or other signs of local infection.  Skin prepped in a sterile fashion.  Local anesthesia: Topical Ethyl chloride.  With sterile technique and under real time ultrasound guidance:  Using an 18-gauge spinal needle advanced into the glenohumeral joint from a posterior approach, injected 1 cc Kenalog 40, 2 cc lidocaine, 2 cc bupivacaine, syringe switched and 20 cc of Isovue injected, syringe again switched and 10 cc of sterile saline injected. Completed without difficulty  Pain immediately resolved suggesting accurate placement of the medication.  Advised to call if fevers/chills, erythema, induration, drainage, or persistent bleeding.  Images permanently stored and available for review in the ultrasound unit.  Impression: Technically successful ultrasound guided injection.
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
## 2019-02-05 ENCOUNTER — Emergency Department (INDEPENDENT_AMBULATORY_CARE_PROVIDER_SITE_OTHER)
Admission: EM | Admit: 2019-02-05 | Discharge: 2019-02-05 | Disposition: A | Discharge status: Home / Self Care | Payer: Worker's Compensation | Source: Home / Self Care

## 2019-02-05 ENCOUNTER — Other Ambulatory Visit: Payer: Self-pay

## 2019-02-05 DIAGNOSIS — S134XXA Sprain of ligaments of cervical spine, initial encounter: Secondary | ICD-10-CM | POA: Diagnosis not present

## 2019-02-05 DIAGNOSIS — M549 Dorsalgia, unspecified: Secondary | ICD-10-CM | POA: Diagnosis not present

## 2019-02-05 DIAGNOSIS — M542 Cervicalgia: Secondary | ICD-10-CM | POA: Diagnosis not present

## 2019-02-05 MED ORDER — METHOCARBAMOL 500 MG PO TABS: ORAL_TABLET | ORAL | 0 refills | Status: DC
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
## 2019-02-05 NOTE — Discharge Instructions (Addendum)
Take Tylenol maximum dose of 3000 mg in 24 hours as needed for pain  Take methocarbamol 500 mg 1 or 2 pills 3 times daily as needed for muscle relaxant  Use ice or heat or alternate them as needed to painful areas.  Use your judgment about returning to work.  Continue trying to stay as loose as you can.  Return if not improving or if worse at any time.
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
## 2019-02-08 ENCOUNTER — Other Ambulatory Visit: Payer: Self-pay

## 2019-02-08 ENCOUNTER — Encounter: Payer: Self-pay | Admitting: Emergency Medicine

## 2019-02-08 ENCOUNTER — Emergency Department (INDEPENDENT_AMBULATORY_CARE_PROVIDER_SITE_OTHER)
Admission: EM | Admit: 2019-02-08 | Discharge: 2019-02-08 | Disposition: A | Discharge status: Home / Self Care | Payer: Worker's Compensation | Source: Home / Self Care

## 2019-02-08 DIAGNOSIS — M545 Low back pain, unspecified: Secondary | ICD-10-CM

## 2019-02-08 DIAGNOSIS — R51 Headache: Secondary | ICD-10-CM | POA: Diagnosis not present

## 2019-02-08 DIAGNOSIS — R42 Dizziness and giddiness: Secondary | ICD-10-CM | POA: Diagnosis not present

## 2019-02-08 DIAGNOSIS — R519 Headache, unspecified: Secondary | ICD-10-CM

## 2019-02-08 NOTE — Discharge Instructions (Signed)
°  Please continue to take your medication as prescribed.  Call to schedule an appointment with Sports Medicine early next week if not improving.  Call 911 or have someone drive you to the closest hospital if symptoms significantly worsening.
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
## 2019-02-12 ENCOUNTER — Encounter: Payer: Self-pay | Admitting: Sports Medicine

## 2019-02-12 ENCOUNTER — Other Ambulatory Visit: Payer: Self-pay

## 2019-02-12 ENCOUNTER — Ambulatory Visit (INDEPENDENT_AMBULATORY_CARE_PROVIDER_SITE_OTHER): Payer: 59

## 2019-02-12 ENCOUNTER — Ambulatory Visit: Payer: 59 | Admitting: Sports Medicine

## 2019-02-12 DIAGNOSIS — S134XXD Sprain of ligaments of cervical spine, subsequent encounter: Secondary | ICD-10-CM

## 2019-02-12 DIAGNOSIS — S134XXA Sprain of ligaments of cervical spine, initial encounter: Secondary | ICD-10-CM | POA: Insufficient documentation

## 2019-02-12 MED ORDER — PREDNISONE 50 MG PO TABS: ORAL_TABLET | ORAL | 0 refills | Status: DC

## 2019-02-12 MED ORDER — HYDROCODONE-ACETAMINOPHEN 5-325 MG PO TABS: 1.0000 | ORAL_TABLET | Freq: Three times a day (TID) | ORAL | 0 refills | Status: DC | PRN
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
## 2019-02-22 ENCOUNTER — Ambulatory Visit: Payer: 59 | Admitting: Sports Medicine

## 2019-03-05 ENCOUNTER — Ambulatory Visit: Payer: 59 | Admitting: Sports Medicine

## 2019-05-30 ENCOUNTER — Other Ambulatory Visit: Payer: Self-pay

## 2019-05-30 ENCOUNTER — Emergency Department (INDEPENDENT_AMBULATORY_CARE_PROVIDER_SITE_OTHER): Payer: 59

## 2019-05-30 ENCOUNTER — Emergency Department
Admission: EM | Admit: 2019-05-30 | Discharge: 2019-05-30 | Disposition: A | Discharge status: Home / Self Care | Payer: 59 | Source: Home / Self Care

## 2019-05-30 DIAGNOSIS — R0781 Pleurodynia: Secondary | ICD-10-CM | POA: Diagnosis not present

## 2019-05-30 MED ORDER — METHOCARBAMOL 500 MG PO TABS: 500.0000 mg | ORAL_TABLET | Freq: Two times a day (BID) | ORAL | 0 refills | Status: DC

## 2019-05-30 NOTE — Discharge Instructions (Signed)
°  Robaxin (methocarbamol) is a muscle relaxer and may cause drowsiness. Do not drink alcohol, drive, or operate heavy machinery while taking.  You may take 500mg  acetaminophen every 4-6 hours as needed for pain, inflammation, and fever.  Please follow up with family medicine or sports medicine in 1 week if not improving, sooner if worsening.

## 2019-05-30 NOTE — ED Triage Notes (Signed)
Pt was unloading things from the back of his truck, and injured right side/ rib area.  Yesterday and today pain has become worse.
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
## 2019-07-31 ENCOUNTER — Ambulatory Visit (INDEPENDENT_AMBULATORY_CARE_PROVIDER_SITE_OTHER): Payer: 59 | Admitting: Sports Medicine

## 2019-07-31 ENCOUNTER — Other Ambulatory Visit: Payer: Self-pay

## 2019-07-31 DIAGNOSIS — M19011 Primary osteoarthritis, right shoulder: Secondary | ICD-10-CM
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
## 2019-08-09 ENCOUNTER — Ambulatory Visit: Payer: 59 | Admitting: Rehabilitative and Restorative Service Providers"

## 2019-08-10 ENCOUNTER — Other Ambulatory Visit: Payer: Self-pay

## 2019-08-10 ENCOUNTER — Emergency Department
Admission: EM | Admit: 2019-08-10 | Discharge: 2019-08-10 | Disposition: A | Discharge status: Home / Self Care | Payer: 59 | Source: Home / Self Care | Attending: Family Medicine | Admitting: Family Medicine

## 2019-08-10 ENCOUNTER — Emergency Department (INDEPENDENT_AMBULATORY_CARE_PROVIDER_SITE_OTHER): Payer: 59

## 2019-08-10 ENCOUNTER — Encounter: Payer: Self-pay | Admitting: Emergency Medicine

## 2019-08-10 DIAGNOSIS — M545 Low back pain, unspecified: Secondary | ICD-10-CM

## 2019-08-10 MED ORDER — PREDNISONE 20 MG PO TABS: ORAL_TABLET | ORAL | 0 refills | Status: DC

## 2019-08-10 NOTE — Discharge Instructions (Addendum)
May take Tylenol as needed for pain. 

## 2019-08-10 NOTE — ED Triage Notes (Signed)
Low back pain woke up with it this morning
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
## 2019-08-11 LAB — VITAMIN D 25 HYDROXY (VIT D DEFICIENCY, FRACTURES): Vit D, 25-Hydroxy: 9 ng/mL — ABNORMAL LOW (ref 30–100)

## 2019-08-13 ENCOUNTER — Telehealth (HOSPITAL_COMMUNITY): Payer: Self-pay | Admitting: Emergency Medicine

## 2019-08-13 ENCOUNTER — Encounter (HOSPITAL_COMMUNITY): Payer: Self-pay

## 2019-08-13 MED ORDER — VITAMIN D (ERGOCALCIFEROL) 1.25 MG (50000 UNIT) PO CAPS: 50000.0000 [IU] | ORAL_CAPSULE | ORAL | 0 refills | Status: AC

## 2019-08-13 NOTE — Telephone Encounter (Signed)
Per Dr. Delton See, pt needs 50,000 Q weekly x4 weeks and follow up PCP for continued management. Attempted to reach patient. No answer at this time. Voicemail left.

## 2019-08-14 ENCOUNTER — Telehealth (HOSPITAL_COMMUNITY): Payer: Self-pay | Admitting: Emergency Medicine

## 2019-08-14 NOTE — Telephone Encounter (Signed)
Patient contacted by phone and made aware of  Vit D results. Pt verbalized understanding and had all questions answered. Will follow up with PCP

## 2019-08-28 ENCOUNTER — Ambulatory Visit: Payer: 59 | Admitting: Sports Medicine

## 2020-07-13 IMAGING — CT CT OF THE RIGHT SHOULDER WITH CONTRAST
3 series · 12 of 33 positions shown, 14 images · non-contrast
Comparison: Radiographs 12/17/2018.

CLINICAL DATA: Persistent right shoulder pain for 3 weeks. No acute
injury or prior relevant surgery. History of diabetes.

EXAM:
CT ARTHROGRAPHY OF THE RIGHT SHOULDER
TECHNIQUE: Multidetector CT imaging was performed following the standard
protocol after injection of dilute contrast into the joint.

[Series 8: ax st · axial · 0.43mm/px · z∈[+37,+153]mm · 4 of 86 slices shown, 5 images]
[im 14/86  soft-tissue]
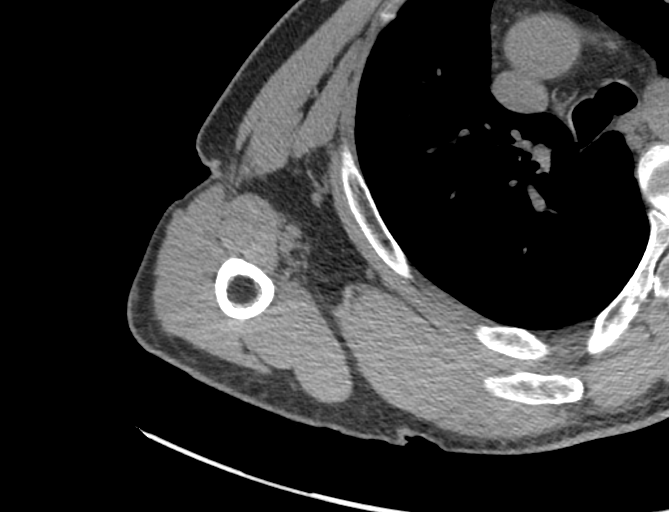
[im 14/86  bone]
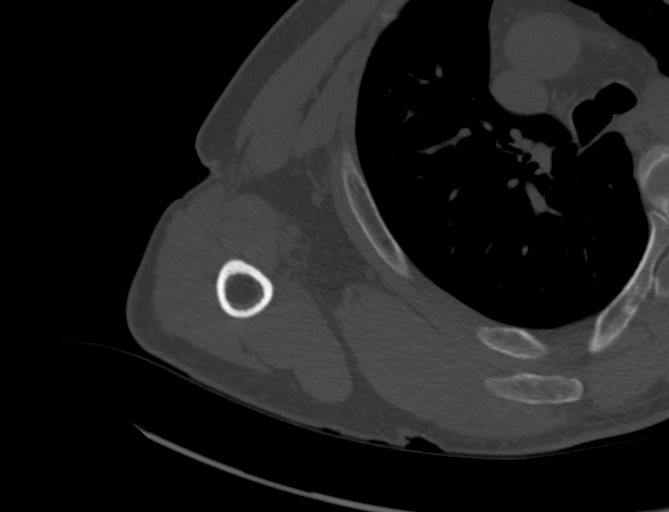
[im 33/86  bone]
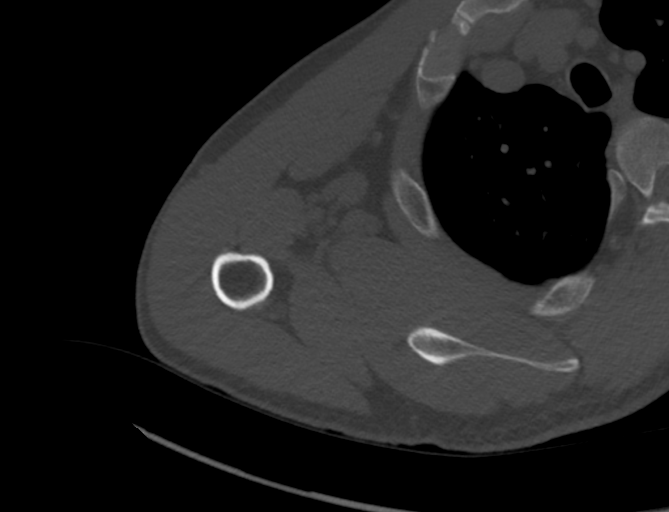
[im 53/86  bone]
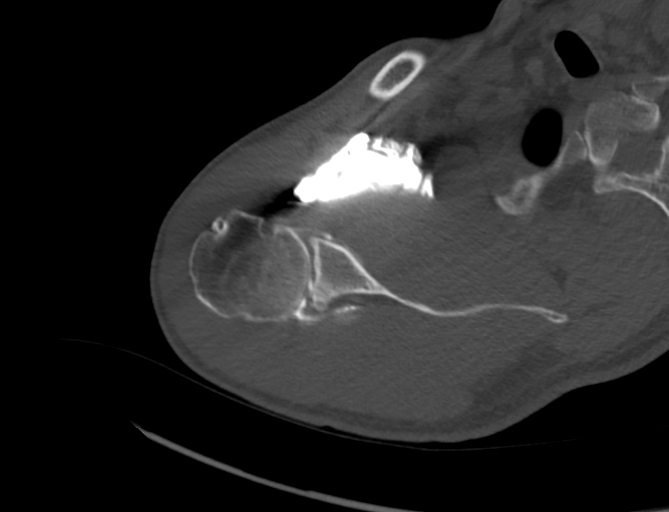
[im 72/86  bone]
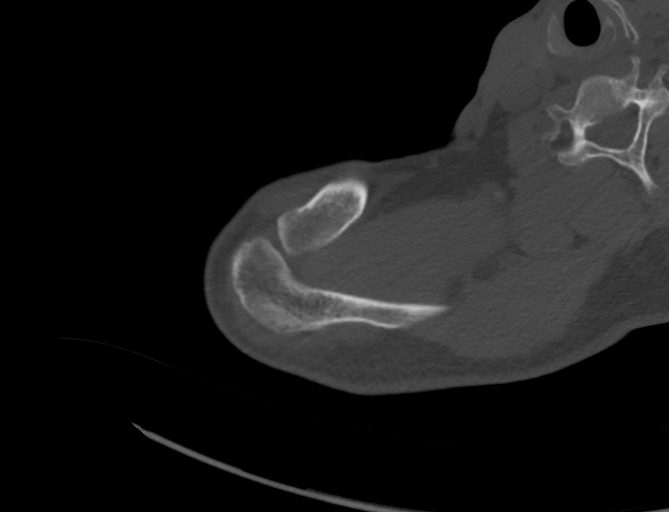

[Series 10: sag st · sagittal · 0.33mm/px · 5 of 108 slices shown, 6 images]
[im 36/108  bone]
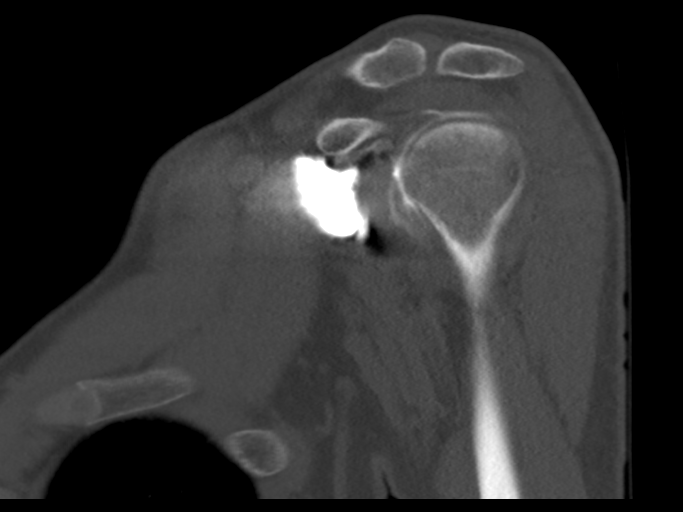
[im 45/108  bone]
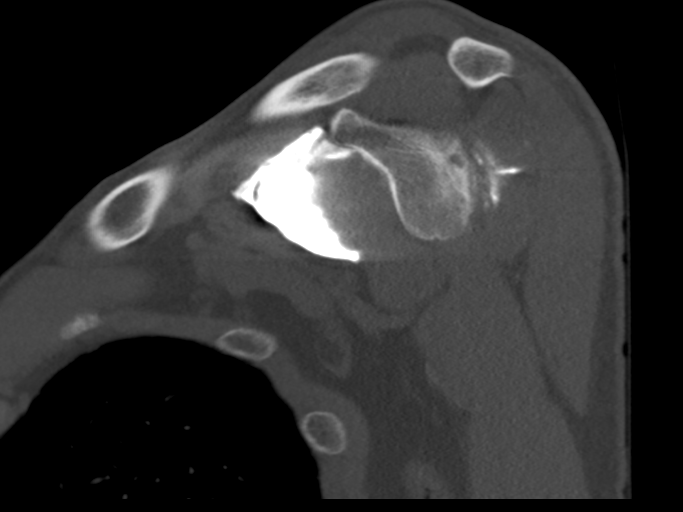
[im 54/108  soft-tissue]
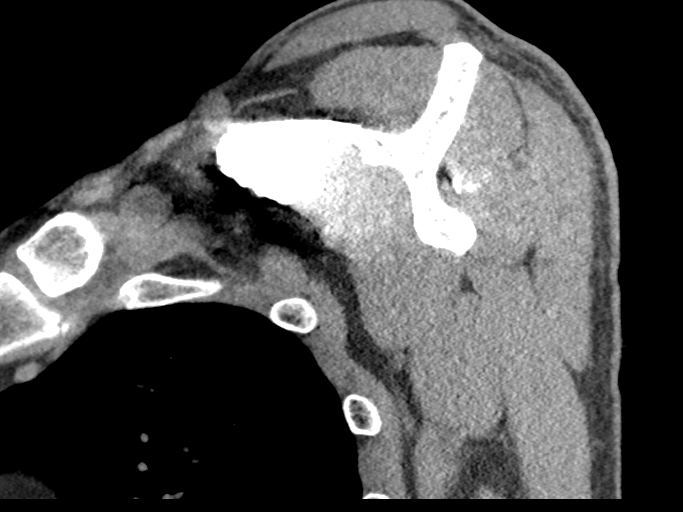
[im 54/108  bone]
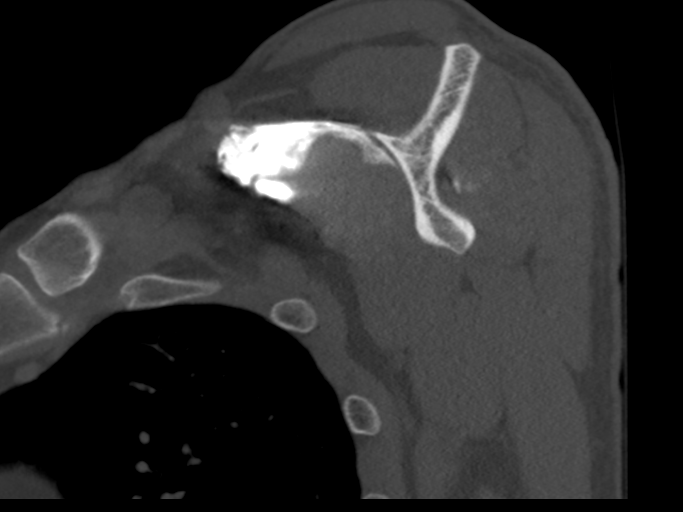
[im 63/108  bone]
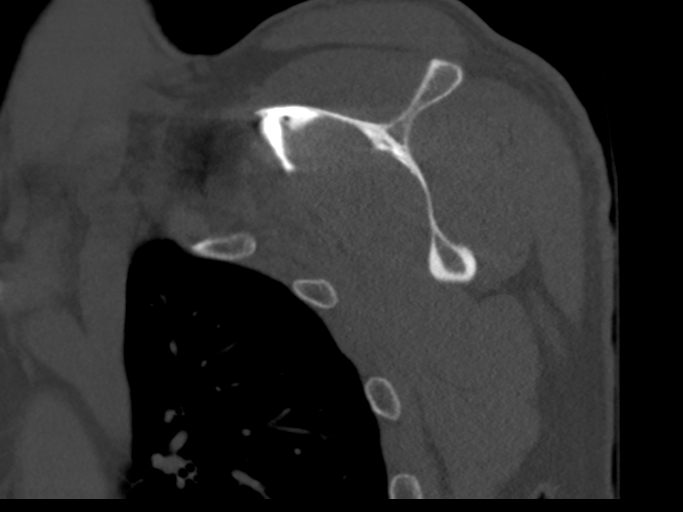
[im 72/108  bone]
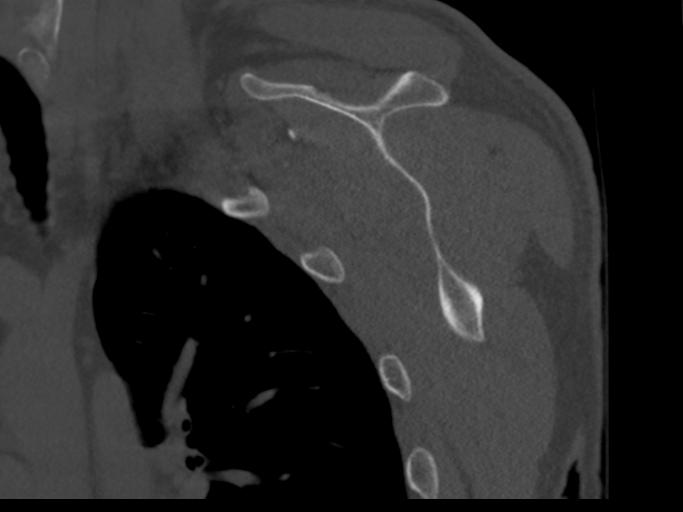

[Series 12: cor st · coronal · 0.39mm/px · 3 of 113 slices shown]
[im 23/113  bone]
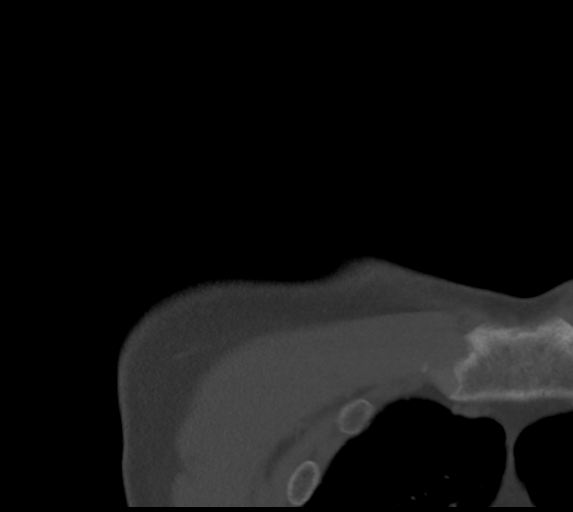
[im 45/113  bone]
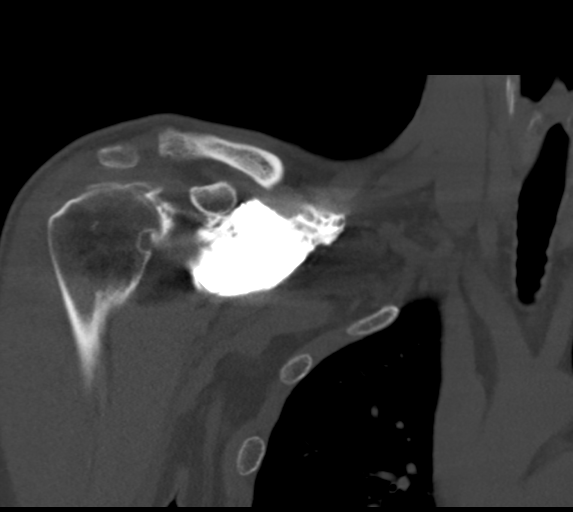
[im 68/113  bone]
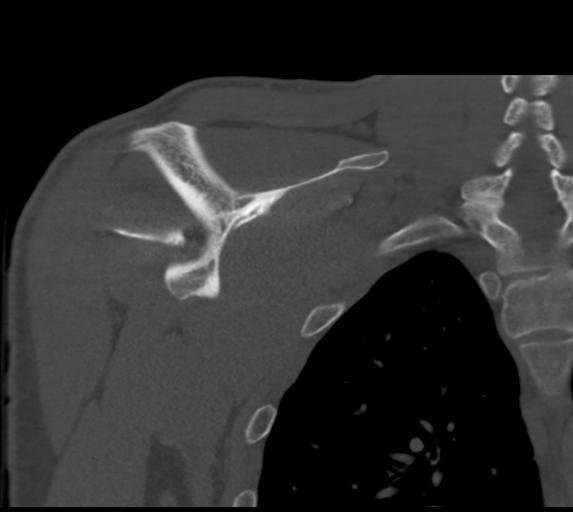

[12 of 33 positions shown; findings below may reference images not displayed]

FINDINGS: Bones/Joint/Cartilage

No evidence of acute fracture or dislocation. There are mild
glenohumeral degenerative changes with posterior glenoid
osteophytes. There is some contrast leakage anteriorly from the
joint into the subcoracoid bursa and surrounding soft tissues. Some
extra-articular contrast is also present posteriorly, presumably at
the injection site. No contrast material is demonstrated in the
subacromial-subdeltoid bursa.

Ligaments

Ligaments are suboptimally evaluated by CT.

Muscles and Tendons
No evidence of rotator cuff tear. There is no articular surface
irregularity of the cuff. The biceps tendon is intact. No focal
muscular atrophy. The posterior labrum is diffusely degenerated and
contains linear high density which is presumably contrast and
indicative of an underlying posterior labral tear. This tear likely
extends into the superior labrum.

Soft Tissues
Unremarkable.
IMPRESSION: 1. Findings are consistent with an extensive degenerative tear of
the posterior labrum. There are associated posterior glenoid
osteophytes.
2. The rotator cuff and biceps tendon appear intact.
3. No acute osseous findings.

## 2022-07-30 ENCOUNTER — Ambulatory Visit
Admission: EM | Admit: 2022-07-30 | Discharge: 2022-07-30 | Disposition: A | Discharge status: Home / Self Care | Payer: No Typology Code available for payment source | Attending: Physician Assistant | Admitting: Physician Assistant

## 2022-07-30 ENCOUNTER — Encounter: Payer: Self-pay | Admitting: Emergency Medicine

## 2022-07-30 DIAGNOSIS — B354 Tinea corporis: Secondary | ICD-10-CM | POA: Diagnosis not present

## 2022-07-30 DIAGNOSIS — L299 Pruritus, unspecified: Secondary | ICD-10-CM

## 2022-07-30 MED ORDER — HYDROCORTISONE 1 % EX CREA: TOPICAL_CREAM | CUTANEOUS | 0 refills | Status: DC

## 2022-07-30 MED ORDER — ECONAZOLE NITRATE 1 % EX CREA: TOPICAL_CREAM | Freq: Two times a day (BID) | CUTANEOUS | 1 refills | Status: DC
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
## 2022-07-30 NOTE — ED Triage Notes (Signed)
Patient c/o circular spots on his neck x 1 month.  The areas are red and flaky.  Patient has used OTC ringworm cream w/o relief.

## 2022-07-30 NOTE — Discharge Instructions (Addendum)
Advised to use the econazole cream and apply it to the area twice daily until the lesions totally resolved.  Apply the cream it will probably take around 4 weeks for the lesions to totally resolve.  Continue to apply the cream for a week after the lesions totally disappear.  Advised to mix the hydrocortisone cream 1% with the econazole cream, equal amounts of both, and then apply to the lesion twice daily for a week.  When the itching resolves only use the Spectazole cream and apply it twice daily to the lesions until they totally resolved.  Advised follow-up PCP or return to urgent care if symptoms fail to improve.

## 2023-02-21 ENCOUNTER — Encounter (HOSPITAL_COMMUNITY): Payer: Self-pay | Admitting: Emergency Medicine

## 2023-02-21 ENCOUNTER — Emergency Department (HOSPITAL_COMMUNITY): Payer: PRIVATE HEALTH INSURANCE

## 2023-02-21 ENCOUNTER — Emergency Department (HOSPITAL_COMMUNITY)
Admission: EM | Admit: 2023-02-21 | Discharge: 2023-02-21 | Disposition: A | Discharge status: Home / Self Care | Payer: PRIVATE HEALTH INSURANCE | Attending: Emergency Medicine | Admitting: Emergency Medicine

## 2023-02-21 DIAGNOSIS — Z789 Other specified health status: Secondary | ICD-10-CM

## 2023-02-21 DIAGNOSIS — E1165 Type 2 diabetes mellitus with hyperglycemia: Secondary | ICD-10-CM | POA: Insufficient documentation

## 2023-02-21 DIAGNOSIS — F1090 Alcohol use, unspecified, uncomplicated: Secondary | ICD-10-CM | POA: Insufficient documentation

## 2023-02-21 DIAGNOSIS — R739 Hyperglycemia, unspecified: Secondary | ICD-10-CM

## 2023-02-21 DIAGNOSIS — M545 Low back pain, unspecified: Secondary | ICD-10-CM | POA: Diagnosis present

## 2023-02-21 LAB — URINALYSIS, ROUTINE W REFLEX MICROSCOPIC
Bacteria, UA: NONE SEEN
Bilirubin Urine: NEGATIVE
Glucose, UA: >=500 mg/dL — AB
Hgb urine dipstick: NEGATIVE
Ketones, ur: 20 mg/dL — AB
Leukocytes,Ua: NEGATIVE
Nitrite: NEGATIVE
Protein, ur: 100 mg/dL — AB
Specific Gravity, Urine: 1.03 (ref 1.005–1.030)
pH: 5 (ref 5.0–8.0)

## 2023-02-21 LAB — BASIC METABOLIC PANEL
Anion gap: 13 (ref 5–15)
BUN: 12 mg/dL (ref 6–20)
CO2: 26 mmol/L (ref 22–32)
Calcium: 9 mg/dL (ref 8.9–10.3)
Chloride: 93 mmol/L — ABNORMAL LOW (ref 98–111)
Creatinine, Ser: 0.81 mg/dL (ref 0.61–1.24)
GFR, Estimated: >60 mL/min (ref >60)
Glucose, Bld: 307 mg/dL — ABNORMAL HIGH (ref 70–99)
Potassium: 4.8 mmol/L (ref 3.5–5.1)
Sodium: 132 mmol/L — ABNORMAL LOW (ref 135–145)

## 2023-02-21 LAB — HEPATIC FUNCTION PANEL
ALT: 54 U/L — ABNORMAL HIGH (ref 0–44)
AST: 30 U/L (ref 15–41)
Albumin: 3.9 g/dL (ref 3.5–5.0)
Alkaline Phosphatase: 124 U/L (ref 38–126)
Bilirubin, Direct: 0.2 mg/dL (ref 0.0–0.2)
Indirect Bilirubin: 1 mg/dL — ABNORMAL HIGH (ref 0.3–0.9)
Total Bilirubin: 1.2 mg/dL (ref 0.3–1.2)
Total Protein: 7 g/dL (ref 6.5–8.1)

## 2023-02-21 LAB — TROPONIN I (HIGH SENSITIVITY): Troponin I (High Sensitivity): 4 ng/L (ref <18)

## 2023-02-21 LAB — CBG MONITORING, ED: Glucose-Capillary: 293 mg/dL — ABNORMAL HIGH (ref 70–99)

## 2023-02-21 LAB — CBC
HCT: 47.3 % (ref 39.0–52.0)
Hemoglobin: 16.1 g/dL (ref 13.0–17.0)
MCH: 30.3 pg (ref 26.0–34.0)
MCHC: 34 g/dL (ref 30.0–36.0)
MCV: 89.1 fL (ref 80.0–100.0)
Platelets: 284 10*3/uL (ref 150–400)
RBC: 5.31 MIL/uL (ref 4.22–5.81)
RDW: 12.3 % (ref 11.5–15.5)
WBC: 8.3 10*3/uL (ref 4.0–10.5)
nRBC: 0 % (ref 0.0–0.2)

## 2023-02-21 MED ORDER — LACTATED RINGERS IV BOLUS
1000.0000 mL | Freq: Once | INTRAVENOUS | Status: AC
  Administered 2023-02-21: 1000 mL via INTRAVENOUS

## 2023-02-21 NOTE — ED Provider Notes (Signed)
[place] EMERGENCY DEPARTMENT AT [facility] Provider Note   CSN: [id number] Arrival date & time: 02/21/23  1013     History  Chief Complaint  Patient presents with   Back Pain    Christian Mathis is a 47 y.o. male.  HPI 47 year old male comes in today complaining of some dizziness and high blood sugar.  He reports that he is a type II diabetic but does take insulin has been out of his insulin.  He states he has some sharp pain in his right mid low back.  This is of unclear chronicity.  He denies any chest pain, numbness, tingling, nausea vomiting, fever or chills     Home Medications Prior to Admission medications   Medication Sig Start Date End Date Taking? Authorizing Provider  acetaminophen (TYLENOL) 325 MG tablet Take 650 mg by mouth every 6 (six) hours as needed.    [provider]  econazole nitrate 1 % cream Apply topically 2 (two) times daily. For fungal rash. 07/30/22   Ellsworth Lennox, PA-C  hydrocortisone cream 1 % Apply to affected area 2 times daily for itching. 07/30/22   Ellsworth Lennox, PA-C  naproxen sodium (ALEVE) 220 MG tablet Take 220 mg by mouth.    [provider]  omeprazole (PRILOSEC) 10 MG capsule Take 10 mg by mouth daily.    [provider]  predniSONE (DELTASONE) 20 MG tablet Take one tab by mouth twice daily for 4 days, then one daily. Take with food. 08/10/19   Lattie Haw, MD      Allergies    Ibuprofen and Morphine and codeine    Review of Systems   Review of Systems  Physical Exam Updated Vital Signs BP (!) 150/100 (BP Location: Right Arm)   Pulse (!) 110   Temp 98.5 F (36.9 C) (Oral)   Resp 16   SpO2 99%  Physical Exam Vitals and nursing note reviewed.  Constitutional:      Appearance: He is well-developed.  HENT:     Head: Normocephalic and atraumatic.     Right Ear: External ear normal.     Left Ear: External ear normal.     Nose: Nose normal.  Eyes:     Extraocular Movements: Extraocular  movements intact.  Neck:     Trachea: No tracheal deviation.  Cardiovascular:     Rate and Rhythm: Normal rate and regular rhythm.  Pulmonary:     Effort: Pulmonary effort is normal.  Abdominal:     General: Bowel sounds are normal.     Palpations: Abdomen is soft.  Musculoskeletal:        General: Normal range of motion.     Cervical back: Normal range of motion.  Skin:    General: Skin is warm and dry.  Neurological:     Mental Status: He is alert and oriented to person, place, and time.  Psychiatric:        Mood and Affect: Mood normal.        Behavior: Behavior normal.     ED Results / Procedures / Treatments   Labs (all labs ordered are listed, but only abnormal results are displayed) Labs Reviewed  BASIC METABOLIC PANEL - Abnormal; Notable for the following components:      Result Value   Sodium 132 (*)    Chloride 93 (*)    Glucose, Bld 307 (*)    All other components within normal limits  URINALYSIS, ROUTINE W REFLEX MICROSCOPIC -  Abnormal; Notable for the following components:   Glucose, UA >=500 (*)    Ketones, ur 20 (*)    Protein, ur 100 (*)    All other components within normal limits  HEPATIC FUNCTION PANEL - Abnormal; Notable for the following components:   ALT 54 (*)    Indirect Bilirubin 1.0 (*)    All other components within normal limits  CBG MONITORING, ED - Abnormal; Notable for the following components:   Glucose-Capillary 293 (*)    All other components within normal limits  CBC  CBG MONITORING, ED  TROPONIN I (HIGH SENSITIVITY)  TROPONIN I (HIGH SENSITIVITY)    EKG EKG Interpretation Date/Time:  Monday February 21 2023 10:24:27 EDT Ventricular Rate:  106 PR Interval:  144 QRS Duration:  78 QT Interval:  338 QTC Calculation: 448 R Axis:   85  Text Interpretation: Sinus tachycardia Otherwise normal ECG No previous ECGs available Confirmed by Margarita Grizzle [phone]) on 02/21/2023 11:38:46 AM  Radiology DG Chest 2 View  Result Date:  02/21/2023 CLINICAL DATA:  Weakness EXAM: CHEST - 2 VIEW COMPARISON:  None available FINDINGS: Cardiomediastinal silhouette and pulmonary vasculature are within normal limits. Lungs are clear. IMPRESSION: No acute cardiopulmonary process. Electronically Signed   By: Acquanetta Belling M.D.   On: 02/21/2023 12:53    Procedures Procedures    Medications Ordered in ED Medications  lactated ringers bolus 1,000 mL (1,000 mLs Intravenous New Bag/Given 02/21/23 1330)    ED Course/ Medical Decision Making/ A&P Clinical Course as of 02/21/23 1632  Mon Feb 21, 2023  1625 Urinalysis reviewed interpreted no evidence acute abnormality noted there is glucose present consistent with patient's diabetes [DR]  1625 CBC reviewed interpreted within normal limits Complete metabolic panel reviewed interpreted significant for hyperglycemia with glucose at 307 no evidence of anion gap Repeat CBG 293 Troponin normal [DR]  1625 X-Joannah Gitlin reviewed interpret within normal limits radiologist interpretation concurs [DR]    Clinical Course User Index [DR] Margarita Grizzle, MD                             Medical Decision Making Amount and/or Complexity of Data Reviewed Labs: ordered. Radiology: ordered.   Well-developed well-nourished 47 year old male presents today complaining of some lightheadedness, and high blood sugar.  He has nonspecific mid right low back pain.  Patient was evaluated here in the ED with labs, urine, chest x-Dyson Sevey, and EKG. Blood sugars were elevated in the 300s he received IV fluids with some decrease.  No evidence of DKA Rate decreased here with IV fluids. Does endorse alcohol use.  And states that he would like to get some help with that. Patient sleeping on my initial reevaluation.  Doubt acute alcohol withdrawal syndrome. He will restart the metformin that he has at home. Patient is given resource guide for drug and alcohol use         Final Clinical Impression(s) / ED Diagnoses Final  diagnoses:  Left-sided low back pain without sciatica, unspecified chronicity  Hyperglycemia  Alcohol use    Rx / DC Orders ED Discharge Orders     None         Margarita Grizzle, MD 02/21/23 [phone]

## 2023-02-21 NOTE — Discharge Instructions (Signed)
Please restart metformin as Discussed You are given the resource guide for access for alcohol resources. Return if you are having worsening symptoms at any time.

## 2023-02-21 NOTE — ED Notes (Signed)
Pt transported to XRay 

## 2023-02-21 NOTE — ED Triage Notes (Signed)
Pt here from home with back pain dizziness and high  blood sugar, stopped taking insulin 2 years ago , pt aslo drinks 12 beers daily

## 2023-05-26 ENCOUNTER — Encounter: Payer: Self-pay | Admitting: *Deleted

## 2023-12-20 ENCOUNTER — Emergency Department (HOSPITAL_COMMUNITY)
Admission: EM | Admit: 2023-12-20 | Discharge: 2023-12-20 | Disposition: A | Discharge status: Home / Self Care | Attending: Emergency Medicine | Admitting: Emergency Medicine

## 2023-12-20 ENCOUNTER — Encounter (HOSPITAL_COMMUNITY): Payer: Self-pay

## 2023-12-20 ENCOUNTER — Other Ambulatory Visit: Payer: Self-pay

## 2023-12-20 DIAGNOSIS — F10930 Alcohol use, unspecified with withdrawal, uncomplicated: Secondary | ICD-10-CM | POA: Insufficient documentation

## 2023-12-20 DIAGNOSIS — M79671 Pain in right foot: Secondary | ICD-10-CM | POA: Diagnosis present

## 2023-12-20 DIAGNOSIS — R059 Cough, unspecified: Secondary | ICD-10-CM | POA: Insufficient documentation

## 2023-12-20 DIAGNOSIS — R000 Tachycardia, unspecified: Secondary | ICD-10-CM | POA: Insufficient documentation

## 2023-12-20 DIAGNOSIS — Z7984 Long term (current) use of oral hypoglycemic drugs: Secondary | ICD-10-CM | POA: Insufficient documentation

## 2023-12-20 DIAGNOSIS — E1142 Type 2 diabetes mellitus with diabetic polyneuropathy: Secondary | ICD-10-CM | POA: Insufficient documentation

## 2023-12-20 DIAGNOSIS — L03115 Cellulitis of right lower limb: Secondary | ICD-10-CM | POA: Insufficient documentation

## 2023-12-20 DIAGNOSIS — R634 Abnormal weight loss: Secondary | ICD-10-CM | POA: Diagnosis not present

## 2023-12-20 HISTORY — DX: Polyneuropathy, unspecified: G62.9

## 2023-12-20 LAB — CBC WITH DIFFERENTIAL/PLATELET
Abs Immature Granulocytes: 0.04 10*3/uL (ref 0.00–0.07)
Basophils Absolute: 0.1 10*3/uL (ref 0.0–0.1)
Basophils Relative: 1 %
Eosinophils Absolute: 0.1 10*3/uL (ref 0.0–0.5)
Eosinophils Relative: 1 %
HCT: 46.2 % (ref 39.0–52.0)
Hemoglobin: 15.7 g/dL (ref 13.0–17.0)
Immature Granulocytes: 0 %
Lymphocytes Relative: 12 %
Lymphs Abs: 1.4 10*3/uL (ref 0.7–4.0)
MCH: 28.8 pg (ref 26.0–34.0)
MCHC: 34 g/dL (ref 30.0–36.0)
MCV: 84.8 fL (ref 80.0–100.0)
Monocytes Absolute: 1.1 10*3/uL — ABNORMAL HIGH (ref 0.1–1.0)
Monocytes Relative: 9 %
Neutro Abs: 8.8 10*3/uL — ABNORMAL HIGH (ref 1.7–7.7)
Neutrophils Relative %: 77 %
Platelets: 299 10*3/uL (ref 150–400)
RBC: 5.45 MIL/uL (ref 4.22–5.81)
RDW: 13.5 % (ref 11.5–15.5)
WBC: 11.5 10*3/uL — ABNORMAL HIGH (ref 4.0–10.5)
nRBC: 0 % (ref 0.0–0.2)

## 2023-12-20 LAB — COMPREHENSIVE METABOLIC PANEL WITH GFR
ALT: 26 U/L (ref 0–44)
AST: 27 U/L (ref 15–41)
Albumin: 4 g/dL (ref 3.5–5.0)
Alkaline Phosphatase: 79 U/L (ref 38–126)
Anion gap: 10 (ref 5–15)
BUN: 7 mg/dL (ref 6–20)
CO2: 26 mmol/L (ref 22–32)
Calcium: 9.2 mg/dL (ref 8.9–10.3)
Chloride: 97 mmol/L — ABNORMAL LOW (ref 98–111)
Creatinine, Ser: 0.77 mg/dL (ref 0.61–1.24)
GFR, Estimated: >60 mL/min (ref >60)
Glucose, Bld: 194 mg/dL — ABNORMAL HIGH (ref 70–99)
Potassium: 4.6 mmol/L (ref 3.5–5.1)
Sodium: 133 mmol/L — ABNORMAL LOW (ref 135–145)
Total Bilirubin: 0.7 mg/dL (ref 0.0–1.2)
Total Protein: 6.9 g/dL (ref 6.5–8.1)

## 2023-12-20 LAB — URINALYSIS, ROUTINE W REFLEX MICROSCOPIC
Bilirubin Urine: NEGATIVE
Glucose, UA: NEGATIVE mg/dL
Hgb urine dipstick: NEGATIVE
Ketones, ur: 5 mg/dL — AB
Leukocytes,Ua: NEGATIVE
Nitrite: NEGATIVE
Protein, ur: NEGATIVE mg/dL
Specific Gravity, Urine: 1.014 (ref 1.005–1.030)
pH: 6 (ref 5.0–8.0)

## 2023-12-20 LAB — I-STAT CG4 LACTIC ACID, ED: Lactic Acid, Venous: 1.3 mmol/L (ref 0.5–1.9)

## 2023-12-20 LAB — MAGNESIUM: Magnesium: 1.7 mg/dL (ref 1.7–2.4)

## 2023-12-20 MED ORDER — THIAMINE HCL 100 MG/ML IJ SOLN: 100.0000 mg | Freq: Every day | INTRAMUSCULAR | Status: DC

## 2023-12-20 MED ORDER — CHLORDIAZEPOXIDE HCL 25 MG PO CAPS
50.0000 mg | ORAL_CAPSULE | Freq: Once | ORAL | Status: AC
  Administered 2023-12-20: 50 mg via ORAL
  Filled 2023-12-20: qty 2

## 2023-12-20 MED ORDER — CEPHALEXIN 500 MG PO CAPS: 500.0000 mg | ORAL_CAPSULE | Freq: Four times a day (QID) | ORAL | 0 refills | Status: DC

## 2023-12-20 MED ORDER — LORAZEPAM 2 MG/ML IJ SOLN: 0.0000 mg | Freq: Two times a day (BID) | INTRAMUSCULAR | Status: DC

## 2023-12-20 MED ORDER — THIAMINE MONONITRATE 100 MG PO TABS
100.0000 mg | ORAL_TABLET | Freq: Every day | ORAL | Status: DC
  Administered 2023-12-20: 100 mg via ORAL
  Filled 2023-12-20: qty 1

## 2023-12-20 MED ORDER — LORAZEPAM 2 MG/ML IJ SOLN
0.0000 mg | Freq: Four times a day (QID) | INTRAMUSCULAR | Status: DC
  Administered 2023-12-20: 1 mg via INTRAVENOUS
  Filled 2023-12-20: qty 1

## 2023-12-20 MED ORDER — CHLORDIAZEPOXIDE HCL 25 MG PO CAPS
ORAL_CAPSULE | ORAL | 0 refills | Status: DC
Start: 2023-12-20 — End: 2024-02-15

## 2023-12-20 MED ORDER — LORAZEPAM 1 MG PO TABS: 0.0000 mg | ORAL_TABLET | Freq: Two times a day (BID) | ORAL | Status: DC

## 2023-12-20 MED ORDER — CEPHALEXIN 250 MG PO CAPS
500.0000 mg | ORAL_CAPSULE | Freq: Once | ORAL | Status: AC
  Administered 2023-12-20: 500 mg via ORAL
  Filled 2023-12-20: qty 2

## 2023-12-20 MED ORDER — SODIUM CHLORIDE 0.9 % IV BOLUS
1000.0000 mL | Freq: Once | INTRAVENOUS | Status: AC
  Administered 2023-12-20: 1000 mL via INTRAVENOUS

## 2023-12-20 MED ORDER — ACETAMINOPHEN 500 MG PO TABS
1000.0000 mg | ORAL_TABLET | Freq: Once | ORAL | Status: AC
  Administered 2023-12-20: 1000 mg via ORAL
  Filled 2023-12-20: qty 2

## 2023-12-20 MED ORDER — LORAZEPAM 1 MG PO TABS: 0.0000 mg | ORAL_TABLET | Freq: Four times a day (QID) | ORAL | Status: DC

## 2023-12-20 NOTE — ED Notes (Signed)
 I have asked patient to urinate, patient is unable to give sample at this time.

## 2023-12-20 NOTE — ED Provider Triage Note (Signed)
 Emergency Medicine Provider Triage Evaluation Note  Christian Mathis , a 48 y.o. male  was evaluated in triage.  Pt complains of tremors.  He has a history of diabetes and neuropathy.  Presents today for feeling tremorous, also notes a wound on his right leg that "will not heal".  Denies fever or chills, no chest pain or shortness of breath but is having some left mid back pain.  Patient drinks about 12 beers a day, has not drank it today because he had to go to work, has been drinking daily for the past year.  Denies a history of withdrawal.  Review of Systems  Positive: As above Negative: As above  Physical Exam  BP (!) 133/92 (BP Location: Right Arm)   Pulse (!) 131   Temp 99.2 F (37.3 C)   Resp (!) 24   SpO2 98%  Gen:   Awake, no distress   Resp:  Normal effort  MSK:   Moves extremities without difficulty  Other:    Medical Decision Making  Medically screening exam initiated at 2:44 PM.  Appropriate orders placed.  Christian Mathis was informed that the remainder of the evaluation will be completed by another provider, this initial triage assessment does not replace that evaluation, and the importance of remaining in the ED until their evaluation is complete.  Patient is tachycardic, history of alcohol use, has not had any alcohol yesterday, concern for possible withdrawal versus other acute finding.     Aimee Houseman, PA-C 12/20/23 1448

## 2023-12-20 NOTE — ED Triage Notes (Signed)
 Pt c/o tremors this am, neuropathy, weight loss; states he is normally 160 lb, now 140 lb x couple of months; bariatric surgery x 1 year ago; denies recent change to diet; pt drinks 12 pack beer/day; last drank yesterday; endorses back and bilateral foot pain; hx DM; denies fevers; wound to R lower leg, erythema noted

## 2023-12-20 NOTE — ED Provider Notes (Signed)
 [place] EMERGENCY DEPARTMENT AT [facility] Provider Note   CSN: [id number] Arrival date & time: 12/20/23  1406     History  No chief complaint on file.  HPI Christian Mathis is a 48 y.o. male with diabetes, neuropathy, S/P cholecystectomy, alcohol overuse presenting for generalized tremors, pain in his feet and weight loss.  He reports that he normally weighs around 160 pounds but over the couple months it is closer to 140 pounds.  Does report bariatric surgery a year ago.  No recent change in his diet.  He also reports that he drinks about a 12 pack of beer per day.  Last drink was yesterday evening.  States the tremors usually occur in the morning but were notably worse this morning.  Denies chest pain or shortness of breath.  Does report a nonproductive cough but states that is normal for him and does report that he is a smoker.  Also reporting pain in the pads of both feet that has been going on for over a year.  He attributes the pain to neuropathy and denies any trauma to his feet.  Denies lower extremity swelling.  Denies SI, HI and hallucinations. Denies chest pain and shortness of breath.  Also mentioned a wound about the right mid shin that has been there for about a month.  Unsure how it happened.  Has been applying Neosporin but has not been helping.  Not oozing or bleeding. Also reported reduced urine output but no painful urination or malodorous urine and no blood in urine.  Denies abdominal pain or flank pain.  HPI     Home Medications Prior to Admission medications   Medication Sig Start Date End Date Taking? Authorizing Provider  cephALEXin (KEFLEX) 500 MG capsule Take 1 capsule (500 mg total) by mouth 4 (four) times daily. 12/20/23  Yes Sherlene Rickel K, PA-C  chlordiazePOXIDE (LIBRIUM) 25 MG capsule 50mg  PO TID x 1D, then 25-50mg  PO BID X 1D, then 25-50mg  PO QD X 1D 12/20/23  Yes Cynthis Purington K, PA-C  gabapentin (NEURONTIN) 400 MG capsule Take 400 mg by mouth 4  (four) times daily as needed. 11/27/23  Yes [provider]  metFORMIN (GLUCOPHAGE) 1000 MG tablet Take 1 tablet by mouth 2 (two) times daily. 03/01/13  Yes [provider]  predniSONE  (DELTASONE ) 20 MG tablet Take one tab by mouth twice daily for 4 days, then one daily. Take with food. Patient not taking: Reported on 12/20/2023 08/10/19   Leon Rajas, MD      Allergies    Ibuprofen and Morphine and codeine    Review of Systems   See HPI  Physical Exam Updated Vital Signs BP 122/89   Pulse 94   Temp 98.1 F (36.7 C) (Oral)   Resp 19   SpO2 90%  Physical Exam Vitals and nursing note reviewed.  HENT:     Head: Normocephalic and atraumatic.     Mouth/Throat:     Mouth: Mucous membranes are moist.  Eyes:     General:        Right eye: No discharge.        Left eye: No discharge.     Conjunctiva/sclera: Conjunctivae normal.  Cardiovascular:     Rate and Rhythm: Regular rhythm. Tachycardia present.     Pulses: Normal pulses.          Radial pulses are 2+ on the right side and 2+ on the left side.  Dorsalis pedis pulses are 2+ on the right side and 2+ on the left side.     Heart sounds: Normal heart sounds.  Pulmonary:     Effort: Pulmonary effort is normal.     Breath sounds: Normal breath sounds.  Abdominal:     General: Abdomen is flat.     Palpations: Abdomen is soft.  Musculoskeletal:     Right foot: Normal range of motion and normal capillary refill. No swelling or tenderness. Normal pulse.     Left foot: Normal range of motion and normal capillary refill. No swelling or tenderness. Normal pulse.       Legs:  Skin:    General: Skin is warm and dry.  Neurological:     General: No focal deficit present.  Psychiatric:        Mood and Affect: Mood normal.     ED Results / Procedures / Treatments   Labs (all labs ordered are listed, but only abnormal results are displayed) Labs Reviewed  CBC WITH DIFFERENTIAL/PLATELET - Abnormal; Notable  for the following components:      Result Value   WBC 11.5 (*)    Neutro Abs 8.8 (*)    Monocytes Absolute 1.1 (*)    All other components within normal limits  COMPREHENSIVE METABOLIC PANEL WITH GFR - Abnormal; Notable for the following components:   Sodium 133 (*)    Chloride 97 (*)    Glucose, Bld 194 (*)    All other components within normal limits  URINALYSIS, ROUTINE W REFLEX MICROSCOPIC - Abnormal; Notable for the following components:   Ketones, ur 5 (*)    All other components within normal limits  MAGNESIUM  LACTIC ACID, PLASMA  LACTIC ACID, PLASMA  I-STAT CG4 LACTIC ACID, ED    EKG EKG Interpretation Date/Time:  Tuesday Dec 20 2023 15:12:22 EDT Ventricular Rate:  120 PR Interval:  136 QRS Duration:  88 QT Interval:  309 QTC Calculation: 437 R Axis:   84  Text Interpretation: Sinus tachycardia Confirmed by Iva Mariner ([id number]) on 12/20/2023 4:35:42 PM  Radiology No results found.  Procedures Procedures    Medications Ordered in ED Medications  LORazepam (ATIVAN) injection 0-4 mg (1 mg Intravenous Given 12/20/23 1609)    Or  LORazepam (ATIVAN) tablet 0-4 mg ( Oral See Alternative 12/20/23 1609)  LORazepam (ATIVAN) injection 0-4 mg (has no administration in time range)    Or  LORazepam (ATIVAN) tablet 0-4 mg (has no administration in time range)  thiamine (VITAMIN B1) tablet 100 mg (100 mg Oral Given 12/20/23 1601)    Or  thiamine (VITAMIN B1) injection 100 mg ( Intravenous See Alternative 12/20/23 1601)  cephALEXin (KEFLEX) capsule 500 mg (has no administration in time range)  sodium chloride 0.9 % bolus 1,000 mL (0 mLs Intravenous Stopped 12/20/23 1715)  acetaminophen  (TYLENOL ) tablet 1,000 mg (1,000 mg Oral Given 12/20/23 1601)  chlordiazePOXIDE (LIBRIUM) capsule 50 mg (50 mg Oral Given 12/20/23 1752)    ED Course/ Medical Decision Making/ A&P                                 Medical Decision Making Risk OTC drugs. Prescription drug  management.   Initial Impression and Ddx 48 year old well-appearing male presenting for tremors, weight loss and issues with alcohol consumption.  Exam revealed tachycardia and wound to the right mid shin with surrounding erythema but otherwise unremarkable.  DDx alcohol withdrawal, sepsis,  UTI, delirium tremens, metabolic derangement, other. Patient PMH that increases complexity of ED encounter:  diabetes, neuropathy, S/P cholecystectomy, alcohol  Interpretation of Diagnostics - I independent reviewed and interpreted the labs as followed: leukocytosis  - I personally reviewed and interpreted EKG which revealed sinus tachycardia.   Patient Reassessment and Ultimate Disposition/Management On reassessment, patient stated that he was "feeling much better" workup was largely reassuring. Tachycardia improved with fluids.  Suspect that he was in mild withdrawal and dehydrated as well.  The wound in his right mid shin is concerning for cellulitis.  Started him on Keflex.  Sent Librium to his pharmacy as patient has been expressing desire to quit alcohol consumption.  TTS was able to provide resources for alcohol rehab in the outpatient setting.  Discussed return precautions.  Discharged in good condition.  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

## 2023-12-20 NOTE — Consult Note (Signed)
 TTS consult ordered for substance abuse resources. Resources have been provided in AVS. Verdia Glad, PA notified.

## 2023-12-20 NOTE — ED Notes (Signed)
 I have asked the patient to give us  urine sample, patient is unable to urinate at this time,.

## 2023-12-20 NOTE — Discharge Instructions (Addendum)
     The [facility] provides a Facility Based Crisis Unit which offers comprehensive behavioral heath care services for mental health and substance abuse treatment.  Social work can also assist with referral to or getting you into a rehabilitation program short or long term. Facility Based Crisis also provides medication assisted Detox. This facility is available to you 24/7.   Also for the wound in your right mid shin, I am sending Keflex which antibiotic to your pharmacy.  Please take this 4 times a day and follow-up with your PCP.  If you start to notice pustulant discharge, worsening redness and swelling, develop a fever or any other concerning symptom please return to the ED for further evaluation.

## 2024-02-06 ENCOUNTER — Emergency Department

## 2024-02-06 ENCOUNTER — Inpatient Hospital Stay: Admitting: Anesthesiology

## 2024-02-06 ENCOUNTER — Other Ambulatory Visit: Payer: Self-pay

## 2024-02-06 ENCOUNTER — Inpatient Hospital Stay
Admission: EM | Admit: 2024-02-06 | Discharge: 2024-02-17 | DRG: 853 | Disposition: A | Discharge status: Home / Self Care | Attending: Internal Medicine | Admitting: Internal Medicine

## 2024-02-06 ENCOUNTER — Encounter
Admission: EM | Disposition: A | Discharge status: Home / Self Care | Payer: Self-pay | Source: Home / Self Care | Attending: Internal Medicine

## 2024-02-06 DIAGNOSIS — F1010 Alcohol abuse, uncomplicated: Secondary | ICD-10-CM | POA: Diagnosis present

## 2024-02-06 DIAGNOSIS — K631 Perforation of intestine (nontraumatic): Secondary | ICD-10-CM | POA: Diagnosis not present

## 2024-02-06 DIAGNOSIS — Z79899 Other long term (current) drug therapy: Secondary | ICD-10-CM | POA: Diagnosis not present

## 2024-02-06 DIAGNOSIS — G629 Polyneuropathy, unspecified: Secondary | ICD-10-CM

## 2024-02-06 DIAGNOSIS — K650 Generalized (acute) peritonitis: Secondary | ICD-10-CM | POA: Diagnosis present

## 2024-02-06 DIAGNOSIS — K9189 Other postprocedural complications and disorders of digestive system: Secondary | ICD-10-CM | POA: Diagnosis not present

## 2024-02-06 DIAGNOSIS — Z9884 Bariatric surgery status: Secondary | ICD-10-CM

## 2024-02-06 DIAGNOSIS — Z886 Allergy status to analgesic agent status: Secondary | ICD-10-CM

## 2024-02-06 DIAGNOSIS — K567 Ileus, unspecified: Secondary | ICD-10-CM | POA: Diagnosis not present

## 2024-02-06 DIAGNOSIS — K659 Peritonitis, unspecified: Secondary | ICD-10-CM | POA: Diagnosis not present

## 2024-02-06 DIAGNOSIS — R112 Nausea with vomiting, unspecified: Secondary | ICD-10-CM | POA: Diagnosis present

## 2024-02-06 DIAGNOSIS — R6521 Severe sepsis with septic shock: Secondary | ICD-10-CM | POA: Diagnosis present

## 2024-02-06 DIAGNOSIS — E876 Hypokalemia: Secondary | ICD-10-CM | POA: Diagnosis present

## 2024-02-06 DIAGNOSIS — E43 Unspecified severe protein-calorie malnutrition: Secondary | ICD-10-CM | POA: Diagnosis present

## 2024-02-06 DIAGNOSIS — Z1152 Encounter for screening for COVID-19: Secondary | ICD-10-CM

## 2024-02-06 DIAGNOSIS — F1090 Alcohol use, unspecified, uncomplicated: Secondary | ICD-10-CM

## 2024-02-06 DIAGNOSIS — E872 Acidosis, unspecified: Secondary | ICD-10-CM | POA: Diagnosis present

## 2024-02-06 DIAGNOSIS — Z681 Body mass index (BMI) 19 or less, adult: Secondary | ICD-10-CM | POA: Diagnosis not present

## 2024-02-06 DIAGNOSIS — A419 Sepsis, unspecified organism: Principal | ICD-10-CM | POA: Diagnosis present

## 2024-02-06 DIAGNOSIS — R030 Elevated blood-pressure reading, without diagnosis of hypertension: Secondary | ICD-10-CM | POA: Diagnosis present

## 2024-02-06 DIAGNOSIS — E1140 Type 2 diabetes mellitus with diabetic neuropathy, unspecified: Secondary | ICD-10-CM | POA: Diagnosis present

## 2024-02-06 DIAGNOSIS — E119 Type 2 diabetes mellitus without complications: Secondary | ICD-10-CM

## 2024-02-06 DIAGNOSIS — F17210 Nicotine dependence, cigarettes, uncomplicated: Secondary | ICD-10-CM | POA: Diagnosis present

## 2024-02-06 DIAGNOSIS — Z7984 Long term (current) use of oral hypoglycemic drugs: Secondary | ICD-10-CM

## 2024-02-06 DIAGNOSIS — R6520 Severe sepsis without septic shock: Secondary | ICD-10-CM | POA: Diagnosis present

## 2024-02-06 DIAGNOSIS — J449 Chronic obstructive pulmonary disease, unspecified: Secondary | ICD-10-CM | POA: Diagnosis present

## 2024-02-06 DIAGNOSIS — Z885 Allergy status to narcotic agent status: Secondary | ICD-10-CM

## 2024-02-06 DIAGNOSIS — Y902 Blood alcohol level of 40-59 mg/100 ml: Secondary | ICD-10-CM | POA: Diagnosis present

## 2024-02-06 DIAGNOSIS — K668 Other specified disorders of peritoneum: Principal | ICD-10-CM

## 2024-02-06 DIAGNOSIS — R109 Unspecified abdominal pain: Secondary | ICD-10-CM

## 2024-02-06 HISTORY — DX: Adjustment disorder with other symptoms: F43.29

## 2024-02-06 HISTORY — DX: Allergic rhinitis, unspecified: J30.9

## 2024-02-06 HISTORY — PX: LAPAROTOMY: SHX154

## 2024-02-06 HISTORY — DX: Gastro-esophageal reflux disease without esophagitis: K21.9

## 2024-02-06 HISTORY — PX: BOWEL RESECTION: SHX1257

## 2024-02-06 HISTORY — DX: Postsurgical malabsorption, not elsewhere classified: Z90.3

## 2024-02-06 HISTORY — DX: Depression, unspecified: F32.A

## 2024-02-06 HISTORY — DX: Postsurgical malabsorption, not elsewhere classified: K91.2

## 2024-02-06 HISTORY — DX: Chronic obstructive pulmonary disease, unspecified: J44.9

## 2024-02-06 HISTORY — DX: Bariatric surgery status: Z98.84

## 2024-02-06 HISTORY — DX: Residual foreign body in soft tissue: M79.5

## 2024-02-06 LAB — CBC WITH DIFFERENTIAL/PLATELET
Abs Immature Granulocytes: 0.04 K/uL (ref 0.00–0.07)
Basophils Absolute: 0 K/uL (ref 0.0–0.1)
Basophils Relative: 0 %
Eosinophils Absolute: 0 K/uL (ref 0.0–0.5)
Eosinophils Relative: 0 %
HCT: 37.3 % — ABNORMAL LOW (ref 39.0–52.0)
Hemoglobin: 13.1 g/dL (ref 13.0–17.0)
Immature Granulocytes: 0 %
Lymphocytes Relative: 4 %
Lymphs Abs: 0.5 K/uL — ABNORMAL LOW (ref 0.7–4.0)
MCH: 30.3 pg (ref 26.0–34.0)
MCHC: 35.1 g/dL (ref 30.0–36.0)
MCV: 86.1 fL (ref 80.0–100.0)
Monocytes Absolute: 0.9 K/uL (ref 0.1–1.0)
Monocytes Relative: 7 %
Neutro Abs: 12.9 K/uL — ABNORMAL HIGH (ref 1.7–7.7)
Neutrophils Relative %: 89 %
Platelets: 212 K/uL (ref 150–400)
RBC: 4.33 MIL/uL (ref 4.22–5.81)
RDW: 13.7 % (ref 11.5–15.5)
WBC: 14.4 K/uL — ABNORMAL HIGH (ref 4.0–10.5)
nRBC: 0 % (ref 0.0–0.2)

## 2024-02-06 LAB — COMPREHENSIVE METABOLIC PANEL WITH GFR
ALT: 34 U/L (ref 0–44)
AST: 23 U/L (ref 15–41)
Albumin: 3.5 g/dL (ref 3.5–5.0)
Alkaline Phosphatase: 96 U/L (ref 38–126)
Anion gap: 14 (ref 5–15)
BUN: 8 mg/dL (ref 6–20)
CO2: 21 mmol/L — ABNORMAL LOW (ref 22–32)
Calcium: 8.9 mg/dL (ref 8.9–10.3)
Chloride: 104 mmol/L (ref 98–111)
Creatinine, Ser: 0.69 mg/dL (ref 0.61–1.24)
GFR, Estimated: >60 mL/min (ref >60)
Glucose, Bld: 155 mg/dL — ABNORMAL HIGH (ref 70–99)
Potassium: 4 mmol/L (ref 3.5–5.1)
Sodium: 139 mmol/L (ref 135–145)
Total Bilirubin: 0.8 mg/dL (ref 0.0–1.2)
Total Protein: 6.3 g/dL — ABNORMAL LOW (ref 6.5–8.1)

## 2024-02-06 LAB — PROTIME-INR
INR: 1 (ref 0.8–1.2)
Prothrombin Time: 13.8 s (ref 11.4–15.2)

## 2024-02-06 LAB — RESP PANEL BY RT-PCR (RSV, FLU A&B, COVID)  RVPGX2
Influenza A by PCR: NEGATIVE
Influenza B by PCR: NEGATIVE
Resp Syncytial Virus by PCR: NEGATIVE
SARS Coronavirus 2 by RT PCR: NEGATIVE

## 2024-02-06 LAB — TYPE AND SCREEN
ABO/RH(D): O POS
Antibody Screen: NEGATIVE

## 2024-02-06 LAB — CBC
HCT: 47.4 % (ref 39.0–52.0)
Hemoglobin: 16.2 g/dL (ref 13.0–17.0)
MCH: 29.7 pg (ref 26.0–34.0)
MCHC: 34.2 g/dL (ref 30.0–36.0)
MCV: 86.8 fL (ref 80.0–100.0)
Platelets: 343 K/uL (ref 150–400)
RBC: 5.46 MIL/uL (ref 4.22–5.81)
RDW: 13.3 % (ref 11.5–15.5)
WBC: 12.1 K/uL — ABNORMAL HIGH (ref 4.0–10.5)
nRBC: 0 % (ref 0.0–0.2)

## 2024-02-06 LAB — LACTIC ACID, PLASMA
Lactic Acid, Venous: 3.2 mmol/L (ref 0.5–1.9)
Lactic Acid, Venous: 2.1 mmol/L (ref 0.5–1.9)

## 2024-02-06 LAB — GLUCOSE, CAPILLARY
Glucose-Capillary: 174 mg/dL — ABNORMAL HIGH (ref 70–99)
Glucose-Capillary: 131 mg/dL — ABNORMAL HIGH (ref 70–99)
Glucose-Capillary: 192 mg/dL — ABNORMAL HIGH (ref 70–99)

## 2024-02-06 LAB — ETHANOL: Alcohol, Ethyl (B): 47 mg/dL — ABNORMAL HIGH (ref <15)

## 2024-02-06 LAB — TROPONIN I (HIGH SENSITIVITY)
Troponin I (High Sensitivity): 4 ng/L (ref <18)
Troponin I (High Sensitivity): 3 ng/L (ref <18)

## 2024-02-06 LAB — LIPASE, BLOOD: Lipase: 29 U/L (ref 11–51)

## 2024-02-06 SURGERY — LAPAROTOMY, EXPLORATORY
Anesthesia: General

## 2024-02-06 MED ORDER — ALBUTEROL SULFATE HFA 108 (90 BASE) MCG/ACT IN AERS
INHALATION_SPRAY | RESPIRATORY_TRACT | Status: AC
  Filled 2024-02-06: qty 6.7

## 2024-02-06 MED ORDER — GABAPENTIN 400 MG PO CAPS
400.0000 mg | ORAL_CAPSULE | Freq: Four times a day (QID) | ORAL | Status: DC | PRN
  Administered 2024-02-17: 400 mg via ORAL
  Filled 2024-02-06: qty 1

## 2024-02-06 MED ORDER — MIDAZOLAM HCL 2 MG/2ML IJ SOLN
INTRAMUSCULAR | Status: AC
  Filled 2024-02-06: qty 2

## 2024-02-06 MED ORDER — ROCURONIUM BROMIDE 10 MG/ML (PF) SYRINGE
PREFILLED_SYRINGE | INTRAVENOUS | Status: AC
  Filled 2024-02-06: qty 10

## 2024-02-06 MED ORDER — KETAMINE HCL 10 MG/ML IJ SOLN
INTRAMUSCULAR | Status: DC | PRN
Start: 2024-02-06 — End: 2024-02-06
  Administered 2024-02-06 (×3): 10 mg via INTRAVENOUS

## 2024-02-06 MED ORDER — INSULIN ASPART 100 UNIT/ML IJ SOLN
0.0000 [IU] | Freq: Three times a day (TID) | INTRAMUSCULAR | Status: AC
  Administered 2024-02-06 (×2): 3 [IU] via SUBCUTANEOUS
  Administered 2024-02-07 – 2024-02-08 (×3): 2 [IU] via SUBCUTANEOUS
  Administered 2024-02-09 – 2024-02-10 (×4): 3 [IU] via SUBCUTANEOUS
  Filled 2024-02-06 (×12): qty 1

## 2024-02-06 MED ORDER — LACTATED RINGERS IV SOLN: INTRAVENOUS | Status: DC | PRN

## 2024-02-06 MED ORDER — PHENYLEPHRINE 80 MCG/ML (10ML) SYRINGE FOR IV PUSH (FOR BLOOD PRESSURE SUPPORT)
PREFILLED_SYRINGE | INTRAVENOUS | Status: AC
Start: 2024-02-06 — End: 2024-02-06
  Filled 2024-02-06: qty 10

## 2024-02-06 MED ORDER — ENOXAPARIN SODIUM 40 MG/0.4ML IJ SOSY
40.0000 mg | PREFILLED_SYRINGE | INTRAMUSCULAR | Status: DC
  Administered 2024-02-06 – 2024-02-16 (×11): 40 mg via SUBCUTANEOUS
  Filled 2024-02-06 (×11): qty 0.4

## 2024-02-06 MED ORDER — ACETAMINOPHEN 10 MG/ML IV SOLN
INTRAVENOUS | Status: AC
  Filled 2024-02-06: qty 100

## 2024-02-06 MED ORDER — SUGAMMADEX SODIUM 200 MG/2ML IV SOLN
INTRAVENOUS | Status: DC | PRN
  Administered 2024-02-06: 150 mg via INTRAVENOUS

## 2024-02-06 MED ORDER — KETAMINE HCL 50 MG/5ML IJ SOSY
PREFILLED_SYRINGE | INTRAMUSCULAR | Status: AC
  Filled 2024-02-06: qty 5

## 2024-02-06 MED ORDER — FENTANYL CITRATE PF 50 MCG/ML IJ SOSY: 50.0000 ug | PREFILLED_SYRINGE | INTRAMUSCULAR | Status: DC | PRN

## 2024-02-06 MED ORDER — FENTANYL CITRATE (PF) 100 MCG/2ML IJ SOLN
INTRAMUSCULAR | Status: DC | PRN
  Administered 2024-02-06: 100 ug via INTRAVENOUS

## 2024-02-06 MED ORDER — PROPOFOL 10 MG/ML IV BOLUS
INTRAVENOUS | Status: AC
  Filled 2024-02-06: qty 20

## 2024-02-06 MED ORDER — SODIUM CHLORIDE 0.9 % IV SOLN
2.0000 g | Freq: Once | INTRAVENOUS | Status: AC
  Administered 2024-02-06: 2 g via INTRAVENOUS
  Filled 2024-02-06: qty 12.5

## 2024-02-06 MED ORDER — SODIUM CHLORIDE 0.9 % IV SOLN: INTRAVENOUS | Status: DC | PRN

## 2024-02-06 MED ORDER — METRONIDAZOLE 500 MG/100ML IV SOLN
500.0000 mg | Freq: Once | INTRAVENOUS | Status: AC
  Administered 2024-02-06: 500 mg via INTRAVENOUS
  Filled 2024-02-06: qty 100

## 2024-02-06 MED ORDER — SODIUM CHLORIDE (PF) 0.9 % IJ SOLN
INTRAMUSCULAR | Status: AC
  Filled 2024-02-06: qty 50

## 2024-02-06 MED ORDER — PIPERACILLIN-TAZOBACTAM 3.375 G IVPB
3.3750 g | Freq: Three times a day (TID) | INTRAVENOUS | Status: DC
  Administered 2024-02-06 – 2024-02-16 (×31): 3.375 g via INTRAVENOUS
  Filled 2024-02-06 (×31): qty 50

## 2024-02-06 MED ORDER — PHENYLEPHRINE HCL-NACL 20-0.9 MG/250ML-% IV SOLN
INTRAVENOUS | Status: DC | PRN
  Administered 2024-02-06: 50 ug/min via INTRAVENOUS

## 2024-02-06 MED ORDER — FENTANYL CITRATE (PF) 100 MCG/2ML IJ SOLN
INTRAMUSCULAR | Status: AC
  Filled 2024-02-06: qty 2

## 2024-02-06 MED ORDER — ONDANSETRON HCL 4 MG/2ML IJ SOLN
INTRAMUSCULAR | Status: DC | PRN
Start: 2024-02-06 — End: 2024-02-06
  Administered 2024-02-06: 4 mg via INTRAVENOUS

## 2024-02-06 MED ORDER — ORAL CARE MOUTH RINSE: 15.0000 mL | Freq: Once | OROMUCOSAL | Status: DC

## 2024-02-06 MED ORDER — PHENYLEPHRINE 80 MCG/ML (10ML) SYRINGE FOR IV PUSH (FOR BLOOD PRESSURE SUPPORT)
PREFILLED_SYRINGE | INTRAVENOUS | Status: AC
  Filled 2024-02-06: qty 10

## 2024-02-06 MED ORDER — HYDROMORPHONE HCL 1 MG/ML IJ SOLN
INTRAMUSCULAR | Status: AC
  Filled 2024-02-06: qty 1

## 2024-02-06 MED ORDER — IOHEXOL 350 MG/ML SOLN
100.0000 mL | Freq: Once | INTRAVENOUS | Status: AC | PRN
  Administered 2024-02-06: 100 mL via INTRAVENOUS

## 2024-02-06 MED ORDER — MIDAZOLAM HCL 2 MG/2ML IJ SOLN
INTRAMUSCULAR | Status: DC | PRN
Start: 2024-02-06 — End: 2024-02-06
  Administered 2024-02-06: 2 mg via INTRAVENOUS

## 2024-02-06 MED ORDER — KETOROLAC TROMETHAMINE 30 MG/ML IJ SOLN
INTRAMUSCULAR | Status: AC
  Filled 2024-02-06: qty 1

## 2024-02-06 MED ORDER — VANCOMYCIN HCL 1500 MG/300ML IV SOLN
1500.0000 mg | Freq: Once | INTRAVENOUS | Status: AC
  Administered 2024-02-06: 1500 mg via INTRAVENOUS
  Filled 2024-02-06: qty 300

## 2024-02-06 MED ORDER — LACTATED RINGERS IV SOLN: INTRAVENOUS | Status: DC

## 2024-02-06 MED ORDER — HYDROMORPHONE HCL 1 MG/ML IJ SOLN
INTRAMUSCULAR | Status: DC | PRN
  Administered 2024-02-06 (×2): .5 mg via INTRAVENOUS

## 2024-02-06 MED ORDER — ALBUMIN HUMAN 5 % IV SOLN
INTRAVENOUS | Status: AC
  Filled 2024-02-06: qty 500

## 2024-02-06 MED ORDER — THIAMINE MONONITRATE 100 MG PO TABS
100.0000 mg | ORAL_TABLET | Freq: Every day | ORAL | Status: DC
  Administered 2024-02-09 – 2024-02-17 (×5): 100 mg via ORAL
  Filled 2024-02-06 (×8): qty 1

## 2024-02-06 MED ORDER — DEXAMETHASONE SODIUM PHOSPHATE 10 MG/ML IJ SOLN
INTRAMUSCULAR | Status: AC
  Filled 2024-02-06: qty 1

## 2024-02-06 MED ORDER — ONDANSETRON HCL 4 MG/2ML IJ SOLN
4.0000 mg | Freq: Four times a day (QID) | INTRAMUSCULAR | Status: DC | PRN
  Administered 2024-02-09: 4 mg via INTRAVENOUS
  Filled 2024-02-06 (×2): qty 2

## 2024-02-06 MED ORDER — ALBUTEROL SULFATE HFA 108 (90 BASE) MCG/ACT IN AERS
INHALATION_SPRAY | RESPIRATORY_TRACT | Status: DC | PRN
Start: 2024-02-06 — End: 2024-02-06
  Administered 2024-02-06: 4 via RESPIRATORY_TRACT

## 2024-02-06 MED ORDER — LORAZEPAM 1 MG PO TABS: 1.0000 mg | ORAL_TABLET | ORAL | Status: AC | PRN

## 2024-02-06 MED ORDER — DEXMEDETOMIDINE HCL IN NACL 80 MCG/20ML IV SOLN
INTRAVENOUS | Status: DC | PRN
  Administered 2024-02-06 (×4): 4 ug via INTRAVENOUS

## 2024-02-06 MED ORDER — ADULT MULTIVITAMIN W/MINERALS CH
1.0000 | ORAL_TABLET | Freq: Every day | ORAL | Status: DC
  Administered 2024-02-09: 1 via ORAL
  Filled 2024-02-06 (×4): qty 1

## 2024-02-06 MED ORDER — LORAZEPAM 2 MG/ML IJ SOLN: 1.0000 mg | INTRAMUSCULAR | Status: AC | PRN

## 2024-02-06 MED ORDER — SUCCINYLCHOLINE CHLORIDE 200 MG/10ML IV SOSY
PREFILLED_SYRINGE | INTRAVENOUS | Status: AC
  Filled 2024-02-06: qty 10

## 2024-02-06 MED ORDER — ONDANSETRON HCL 4 MG/2ML IJ SOLN
INTRAMUSCULAR | Status: AC
  Filled 2024-02-06: qty 2

## 2024-02-06 MED ORDER — CHLORHEXIDINE GLUCONATE CLOTH 2 % EX PADS
6.0000 | MEDICATED_PAD | Freq: Every day | CUTANEOUS | Status: DC
  Administered 2024-02-06: 6 via TOPICAL
  Filled 2024-02-06: qty 6

## 2024-02-06 MED ORDER — LACTATED RINGERS IV BOLUS (SEPSIS)
1000.0000 mL | Freq: Once | INTRAVENOUS | Status: AC
  Administered 2024-02-06: 1000 mL via INTRAVENOUS

## 2024-02-06 MED ORDER — THIAMINE HCL 100 MG/ML IJ SOLN
100.0000 mg | Freq: Every day | INTRAMUSCULAR | Status: DC
  Administered 2024-02-06 – 2024-02-14 (×7): 100 mg via INTRAVENOUS
  Filled 2024-02-06 (×7): qty 2

## 2024-02-06 MED ORDER — DEXAMETHASONE SODIUM PHOSPHATE 10 MG/ML IJ SOLN
INTRAMUSCULAR | Status: DC | PRN
  Administered 2024-02-06: 8 mg via INTRAVENOUS

## 2024-02-06 MED ORDER — 0.9 % SODIUM CHLORIDE (POUR BTL) OPTIME
TOPICAL | Status: DC | PRN
  Administered 2024-02-06: 500 mL
  Administered 2024-02-06: 3000 mL
  Administered 2024-02-06: 500 mL

## 2024-02-06 MED ORDER — PROPOFOL 10 MG/ML IV BOLUS
INTRAVENOUS | Status: DC | PRN
  Administered 2024-02-06: 200 mg via INTRAVENOUS

## 2024-02-06 MED ORDER — ONDANSETRON HCL 4 MG/2ML IJ SOLN
4.0000 mg | Freq: Once | INTRAMUSCULAR | Status: AC
  Administered 2024-02-06: 4 mg via INTRAVENOUS
  Filled 2024-02-06: qty 2

## 2024-02-06 MED ORDER — PHENYLEPHRINE HCL-NACL 20-0.9 MG/250ML-% IV SOLN
INTRAVENOUS | Status: AC
  Filled 2024-02-06: qty 250

## 2024-02-06 MED ORDER — PANTOPRAZOLE SODIUM 40 MG IV SOLR
80.0000 mg | Freq: Once | INTRAVENOUS | Status: AC
  Administered 2024-02-06: 80 mg via INTRAVENOUS
  Filled 2024-02-06: qty 20

## 2024-02-06 MED ORDER — BUPIVACAINE LIPOSOME 1.3 % IJ SUSP
INTRAMUSCULAR | Status: AC
  Filled 2024-02-06: qty 20

## 2024-02-06 MED ORDER — BUPIVACAINE-EPINEPHRINE (PF) 0.5% -1:200000 IJ SOLN
INTRAMUSCULAR | Status: AC
  Filled 2024-02-06: qty 30

## 2024-02-06 MED ORDER — FENTANYL CITRATE PF 50 MCG/ML IJ SOSY
100.0000 ug | PREFILLED_SYRINGE | Freq: Once | INTRAMUSCULAR | Status: AC
  Administered 2024-02-06: 100 ug via INTRAVENOUS
  Filled 2024-02-06: qty 2

## 2024-02-06 MED ORDER — ACETAMINOPHEN 10 MG/ML IV SOLN
INTRAVENOUS | Status: DC | PRN
  Administered 2024-02-06: 1000 mg via INTRAVENOUS

## 2024-02-06 MED ORDER — PANTOPRAZOLE SODIUM 40 MG IV SOLR
40.0000 mg | Freq: Two times a day (BID) | INTRAVENOUS | Status: DC
  Administered 2024-02-06 – 2024-02-17 (×23): 40 mg via INTRAVENOUS
  Filled 2024-02-06 (×23): qty 10

## 2024-02-06 MED ORDER — VANCOMYCIN HCL IN DEXTROSE 1-5 GM/200ML-% IV SOLN: 1000.0000 mg | Freq: Once | INTRAVENOUS | Status: DC

## 2024-02-06 MED ORDER — HYDROMORPHONE HCL 1 MG/ML IJ SOLN: 0.5000 mg | INTRAMUSCULAR | Status: DC | PRN

## 2024-02-06 MED ORDER — SODIUM CHLORIDE (PF) 0.9 % IJ SOLN
INTRAMUSCULAR | Status: DC | PRN
  Administered 2024-02-06: 100 mL via INTRAMUSCULAR

## 2024-02-06 MED ORDER — LIDOCAINE HCL (CARDIAC) PF 100 MG/5ML IV SOSY
PREFILLED_SYRINGE | INTRAVENOUS | Status: DC | PRN
  Administered 2024-02-06: 100 mg via INTRAVENOUS

## 2024-02-06 MED ORDER — THIAMINE HCL 100 MG/ML IJ SOLN
100.0000 mg | Freq: Once | INTRAMUSCULAR | Status: DC
  Filled 2024-02-06: qty 2

## 2024-02-06 MED ORDER — SUCCINYLCHOLINE CHLORIDE 200 MG/10ML IV SOSY
PREFILLED_SYRINGE | INTRAVENOUS | Status: DC | PRN
  Administered 2024-02-06: 160 mg via INTRAVENOUS

## 2024-02-06 MED ORDER — ALBUMIN HUMAN 5 % IV SOLN: INTRAVENOUS | Status: DC | PRN

## 2024-02-06 MED ORDER — CHLORHEXIDINE GLUCONATE 0.12 % MT SOLN: 15.0000 mL | Freq: Once | OROMUCOSAL | Status: DC

## 2024-02-06 MED ORDER — ROCURONIUM BROMIDE 100 MG/10ML IV SOLN
INTRAVENOUS | Status: DC | PRN
  Administered 2024-02-06: 10 mg via INTRAVENOUS
  Administered 2024-02-06: 40 mg via INTRAVENOUS
  Administered 2024-02-06: 20 mg via INTRAVENOUS
  Administered 2024-02-06: 30 mg via INTRAVENOUS

## 2024-02-06 MED ORDER — HYDROMORPHONE HCL 1 MG/ML IJ SOLN
0.5000 mg | INTRAMUSCULAR | Status: DC | PRN
  Administered 2024-02-06 – 2024-02-15 (×94): 1 mg via INTRAVENOUS
  Filled 2024-02-06 (×95): qty 1

## 2024-02-06 MED ORDER — PHENYLEPHRINE 80 MCG/ML (10ML) SYRINGE FOR IV PUSH (FOR BLOOD PRESSURE SUPPORT)
PREFILLED_SYRINGE | INTRAVENOUS | Status: DC | PRN
  Administered 2024-02-06: 80 ug via INTRAVENOUS
  Administered 2024-02-06 (×4): 160 ug via INTRAVENOUS
  Administered 2024-02-06: 80 ug via INTRAVENOUS

## 2024-02-06 MED ORDER — LACTATED RINGERS IV SOLN: INTRAVENOUS | Status: AC

## 2024-02-06 MED ORDER — FOLIC ACID 1 MG PO TABS
1.0000 mg | ORAL_TABLET | Freq: Every day | ORAL | Status: DC
  Filled 2024-02-06 (×3): qty 1

## 2024-02-06 MED ORDER — ONDANSETRON HCL 4 MG PO TABS
4.0000 mg | ORAL_TABLET | Freq: Four times a day (QID) | ORAL | Status: DC | PRN
  Filled 2024-02-06: qty 1

## 2024-02-06 SURGICAL SUPPLY — 44 items
CATH FOLEY SIL 2WAY 12FR5CC (CATHETERS) IMPLANT
CATH FOLEY SIL 2WAY 14FR5CC (CATHETERS) IMPLANT
CATH ROBINSON RED A/P 16FR (CATHETERS) IMPLANT
CHLORAPREP W/TINT 26 (MISCELLANEOUS) IMPLANT
DRAIN CHANNEL JP 15F RND 3/16 (MISCELLANEOUS) IMPLANT
DRAPE LAPAROTOMY 100X77 ABD (DRAPES) ×2 IMPLANT
DRSG OPSITE POSTOP 4X10 (GAUZE/BANDAGES/DRESSINGS) IMPLANT
DRSG OPSITE POSTOP 4X8 (GAUZE/BANDAGES/DRESSINGS) IMPLANT
ELECT BLADE 6.5 EXT (BLADE) ×2 IMPLANT
ELECTRODE REM PT RTRN 9FT ADLT (ELECTROSURGICAL) ×2 IMPLANT
EVACUATOR SILICONE 100CC (DRAIN) IMPLANT
GLOVE BIO SURGEON STRL SZ 6.5 (GLOVE) ×2 IMPLANT
GLOVE BIOGEL PI IND STRL 6.5 (GLOVE) ×2 IMPLANT
GLOVE SURG SYN 6.5 PF PI (GLOVE) ×4 IMPLANT
GOWN STRL REUS W/ TWL LRG LVL3 (GOWN DISPOSABLE) ×6 IMPLANT
HANDLE YANKAUER SUCT BULB TIP (MISCELLANEOUS) IMPLANT
KIT PREVENA INCISION MGT20CM45 (CANNISTER) IMPLANT
KIT TURNOVER KIT A (KITS) ×2 IMPLANT
LABEL OR SOLS (LABEL) ×2 IMPLANT
LIGASURE IMPACT 36 18CM CVD LR (INSTRUMENTS) IMPLANT
MANIFOLD NEPTUNE II (INSTRUMENTS) ×2 IMPLANT
NDL HYPO 22X1.5 SAFETY MO (MISCELLANEOUS) ×2 IMPLANT
NEEDLE HYPO 22X1.5 SAFETY MO (MISCELLANEOUS) ×2 IMPLANT
NS IRRIG 1000ML POUR BTL (IV SOLUTION) ×2 IMPLANT
PACK BASIN MAJOR ARMC (MISCELLANEOUS) ×2 IMPLANT
PACK COLON CLEAN CLOSURE (MISCELLANEOUS) ×2 IMPLANT
RELOAD PROXIMATE 75MM BLUE (ENDOMECHANICALS) ×4 IMPLANT
RELOAD STAPLE 75 3.8 BLU REG (ENDOMECHANICALS) IMPLANT
RETRACTOR WND ALEXIS-O 25 LRG (MISCELLANEOUS) IMPLANT
SPONGE T-LAP 18X18 ~~LOC~~+RFID (SPONGE) IMPLANT
STAPLER GUN LINEAR PROX 60 (STAPLE) IMPLANT
STAPLER PROXIMATE 75MM BLUE (STAPLE) IMPLANT
STAPLER SKIN PROX 35W (STAPLE) ×2 IMPLANT
SUT ETHILON 3-0 (SUTURE) IMPLANT
SUT PDS AB 0 CT1 27 (SUTURE) IMPLANT
SUT PROLENE 2 0 SH DA (SUTURE) IMPLANT
SUT SILK 3 0 SH CR/8 (SUTURE) IMPLANT
SUT STRATA 2-0 23CM CT-2 (SUTURE) IMPLANT
SUT STRATAFIX PDS 30 CT-1 (SUTURE) IMPLANT
SUT VIC AB 3-0 SH 27X BRD (SUTURE) ×2 IMPLANT
SYR 20ML LL LF (SYRINGE) ×4 IMPLANT
TRAP FLUID SMOKE EVACUATOR (MISCELLANEOUS) ×2 IMPLANT
TRAY FOLEY MTR SLVR 16FR STAT (SET/KITS/TRAYS/PACK) ×2 IMPLANT
WATER STERILE IRR 500ML POUR (IV SOLUTION) ×2 IMPLANT

## 2024-02-06 NOTE — Anesthesia Procedure Notes (Signed)
 Procedure Name: Intubation Date/Time: 02/06/2024 9:38 AM  Performed by: Dyane Mass, CRNAPre-anesthesia Checklist: Patient identified, Emergency Drugs available, Suction available and Patient being monitored Patient Re-evaluated:Patient Re-evaluated prior to induction Oxygen Delivery Method: Circle system utilized Preoxygenation: Pre-oxygenation with 100% oxygen Induction Type: IV induction, Rapid sequence and Cricoid Pressure applied Ventilation: Mask ventilation without difficulty Laryngoscope Size: McGrath and 4 Grade View: Grade I Tube type: Oral Tube size: 7.5 mm Number of attempts: 1 Airway Equipment and Method: Stylet and Oral airway Placement Confirmation: ETT inserted through vocal cords under direct vision, positive ETCO2 and breath sounds checked- equal and bilateral Secured at: 22 cm Tube secured with: Tape Dental Injury: Teeth and Oropharynx as per pre-operative assessment

## 2024-02-06 NOTE — Anesthesia Postprocedure Evaluation (Signed)
 Anesthesia Post Note  Patient: Christian Mathis  Procedure(s) Performed: LAPAROTOMY, EXPLORATORY EXCISION, SMALL INTESTINE  Patient location during evaluation: PACU Anesthesia Type: General Level of consciousness: awake and alert Pain management: pain level controlled Vital Signs Assessment: post-procedure vital signs reviewed and stable Respiratory status: spontaneous breathing, nonlabored ventilation, respiratory function stable and patient connected to nasal cannula oxygen Cardiovascular status: blood pressure returned to baseline and stable Postop Assessment: no apparent nausea or vomiting Anesthetic complications: no   No notable events documented.   Last Vitals:  Vitals:   02/06/24 1410 02/06/24 1430  BP: 123/75 115/76  Pulse: 90 90  Resp: 13   Temp:  36.8 C  SpO2: 95% 95%    Last Pain:  Vitals:   02/06/24 1430  TempSrc: Oral  PainSc:                  Donny JAYSON Mu

## 2024-02-06 NOTE — ED Notes (Signed)
 MD Ward informed of lactic of 3.2

## 2024-02-06 NOTE — ED Notes (Signed)
 Pt transported to 226 by christina EDT

## 2024-02-06 NOTE — Progress Notes (Signed)
 Pt being followed by [facility] for Sepsis protocol.

## 2024-02-06 NOTE — ED Provider Notes (Signed)
 [facility] Provider Note    Event Date/Time   First MD Initiated Contact with Patient 02/06/24 0543     (approximate)   History   Abdominal Pain   HPI  Christian Mathis is a 48 y.o. male with history of alcohol use disorder, diabetes, neuropathy, Roux-en-Y gastric bypass, cholecystectomy who presents to the emergency department with sudden onset upper abdominal pain that woke him from sleep.  Feels similar to previous episodes of pancreatitis.  Patient is tachycardic, hypotensive here.  He denies any fevers, chills.  Rectal temp is 99.2.  No vomiting, diarrhea, bloody stools, melena.  He is Hemoccult negative here.  He denies any chest pain or shortness of breath.  He is not on any blood pressure medication and states that his blood pressures are usually within normal range.  States last drink alcohol yesterday.  No drug use.  Denies previous cardiac history.  No history of aortic dissection, aneurysm.   History provided by patient.    Past Medical History:  Diagnosis Date   Diabetes mellitus without complication (HCC)    Neuropathy     Past Surgical History:  Procedure Laterality Date   BARIATRIC SURGERY     CHOLECYSTECTOMY     GASTRIC BYPASS      MEDICATIONS:  Prior to Admission medications   Medication Sig Start Date End Date Taking? Authorizing Provider  cephALEXin  (KEFLEX ) 500 MG capsule Take 1 capsule (500 mg total) by mouth 4 (four) times daily. 12/20/23   Robinson, John K, PA-C  chlordiazePOXIDE  (LIBRIUM ) 25 MG capsule 50mg  PO TID x 1D, then 25-50mg  PO BID X 1D, then 25-50mg  PO QD X 1D 12/20/23   Robinson, John K, PA-C  gabapentin  (NEURONTIN ) 400 MG capsule Take 400 mg by mouth 4 (four) times daily as needed. 11/27/23   [provider]  metFORMIN (GLUCOPHAGE) 1000 MG tablet Take 1 tablet by mouth 2 (two) times daily. 03/01/13   [provider]  predniSONE  (DELTASONE ) 20 MG tablet Take one tab by mouth twice daily for 4 days, then  one daily. Take with food. Patient not taking: Reported on 12/20/2023 08/10/19   Pauline Garnette LABOR, MD    Physical Exam   Triage Vital Signs: ED Triage Vitals  Encounter Vitals Group     BP 02/06/24 0532 (!) 79/57     Girls Systolic BP Percentile --      Girls Diastolic BP Percentile --      Boys Systolic BP Percentile --      Boys Diastolic BP Percentile --      Pulse Rate 02/06/24 0532 (!) 133     Resp 02/06/24 0532 18     Temp 02/06/24 0532 97.7 F (36.5 C)     Temp Source 02/06/24 0532 Oral     SpO2 02/06/24 0532 99 %     Weight 02/06/24 0534 145 lb (65.8 kg)     Height 02/06/24 0534 5' 9 (1.753 m)     Head Circumference --      Peak Flow --      Pain Score 02/06/24 0534 10     Pain Loc --      Pain Education --      Exclude from Growth Chart --     Most recent vital signs: Vitals:   02/06/24 0700 02/06/24 0715  BP: 128/74   Pulse: (!) 119 (!) 130  Resp: (!) 27 14  Temp:    SpO2: 93% 96%  CONSTITUTIONAL: Alert, responds appropriately to questions.  Appears uncomfortable, thin HEAD: Normocephalic, atraumatic EYES: Conjunctivae clear, pupils appear equal, sclera nonicteric ENT: normal nose; moist mucous membranes NECK: Supple, normal ROM CARD: Regular and tachycardic; S1 and S2 appreciated RESP: Normal chest excursion without splinting or tachypnea; breath sounds clear and equal bilaterally; no wheezes, no rhonchi, no rales, no hypoxia or respiratory distress, speaking full sentences ABD/GI: Non-distended; soft, tender throughout the upper abdomen with guarding, no rebound RECTAL:  Normal rectal tone, no gross blood or melena, guaiac NEGATIVE, no hemorrhoids appreciated, nontender rectal exam, no fecal impaction. Chaperone present. BACK: The back appears normal EXT: Normal ROM in all joints; no deformity noted, no edema SKIN: Normal color for age and race; warm; no rash on exposed skin NEURO: Moves all extremities equally, normal speech, no facial  asymmetry PSYCH: The patient's mood and manner are appropriate.   ED Results / Procedures / Treatments   LABS: (all labs ordered are listed, but only abnormal results are displayed) Labs Reviewed  COMPREHENSIVE METABOLIC PANEL WITH GFR - Abnormal; Notable for the following components:      Result Value   CO2 21 (*)    Glucose, Bld 155 (*)    Total Protein 6.3 (*)    All other components within normal limits  CBC - Abnormal; Notable for the following components:   WBC 12.1 (*)    All other components within normal limits  LACTIC ACID, PLASMA - Abnormal; Notable for the following components:   Lactic Acid, Venous 3.2 (*)    All other components within normal limits  ETHANOL - Abnormal; Notable for the following components:   Alcohol, Ethyl (B) 47 (*)    All other components within normal limits  RESP PANEL BY RT-PCR (RSV, FLU A&B, COVID)  RVPGX2  CULTURE, BLOOD (ROUTINE X 2)  CULTURE, BLOOD (ROUTINE X 2)  LIPASE, BLOOD  PROTIME-INR  LACTIC ACID, PLASMA  URINALYSIS, W/ REFLEX TO CULTURE (INFECTION SUSPECTED)  DIFFERENTIAL  URINE DRUG SCREEN, QUALITATIVE ([facility] ONLY)  TYPE AND SCREEN  TROPONIN I (HIGH SENSITIVITY)  TROPONIN I (HIGH SENSITIVITY)     EKG:  EKG Interpretation Date/Time:  Monday February 06 2024 07:01:29 EDT Ventricular Rate:  121 PR Interval:  145 QRS Duration:  84 QT Interval:  323 QTC Calculation: 459 R Axis:   87  Text Interpretation: Sinus tachycardia Probable anteroseptal infarct, old Confirmed by Neomi Neptune [phone]) on 02/06/2024 7:17:39 AM         RADIOLOGY: My personal review and interpretation of imaging: Intraperitoneal free air.  I have personally reviewed all radiology reports.   DG Abd 2 Views Result Date: 02/06/2024 CLINICAL DATA:  Sepsis.  Evaluate for free air. EXAM: ABDOMEN - 2 VIEW COMPARISON:  None FINDINGS: Right upper quadrant cholecystectomy clips. Contrast material is identified within the collecting systems, ureters and  urinary bladder. Bowel gas pattern is nonspecific. No pathologic dilatation of the large or small bowel loops. Pneumoperitoneum on the CTA obtained at 6:51 a.m. is suboptimally visualized by plain film technique. Visualized osseous structures appear grossly intact. IMPRESSION: 1. Nonspecific bowel gas pattern. No signs of high-grade bowel obstruction. 2. Pneumoperitoneum on the CTA obtained at 6:51 AM is suboptimally visualized by plain film technique. Electronically Signed   By: Waddell Calk M.D.   On: 02/06/2024 07:26   DG Chest Port 1 View Result Date: 02/06/2024 CLINICAL DATA:  Questionable sepsis.  Evaluate for free air. EXAM: PORTABLE CHEST 1 VIEW COMPARISON:  02/21/2023 FINDINGS: The heart size  and mediastinal contours are within normal limits. Both lungs are clear. The visualized skeletal structures are unremarkable. IMPRESSION: No active disease. Electronically Signed   By: Waddell Calk M.D.   On: 02/06/2024 07:22     PROCEDURES:  Critical Care performed: Yes, see critical care procedure note(s)   CRITICAL CARE Performed by: Josette Tyberius Ryner   Total critical care time: 45 minutes  Critical care time was exclusive of separately billable procedures and treating other patients.  Critical care was necessary to treat or prevent imminent or life-threatening deterioration.  Critical care was time spent personally by me on the following activities: development of treatment plan with patient and/or surrogate as well as nursing, discussions with consultants, evaluation of patient's response to treatment, examination of patient, obtaining history from patient or surrogate, ordering and performing treatments and interventions, ordering and review of laboratory studies, ordering and review of radiographic studies, pulse oximetry and re-evaluation of patient's condition.   SABRA1-3 Lead EKG Interpretation  Performed by: Haynes Giannotti, Josette SAILOR, DO Authorized by: Bernedette Auston, Josette SAILOR, DO     Interpretation:  abnormal     ECG rate:  113   ECG rate assessment: tachycardic     Rhythm: sinus tachycardia     Ectopy: none     Conduction: normal       IMPRESSION / MDM / ASSESSMENT AND PLAN / ED COURSE  I reviewed the triage vital signs and the nursing notes.    Patient here with complaints of upper abdominal pain.  He is tachycardic, hypotensive here.  The patient is on the cardiac monitor to evaluate for evidence of arrhythmia and/or significant heart rate changes.   DIFFERENTIAL DIAGNOSIS (includes but not limited to):   Alcohol induced pancreatitis, dehydration, anemia, dissection, aortic aneurysm rupture, ACS, gastric ulcer, gastritis, viscus perforation, sepsis   Patient's presentation is most consistent with acute presentation with potential threat to life or bodily function.   PLAN: Will obtain labs, urine, cultures.  Will obtain chest x-ray, upright abdominal x-ray to evaluate for pneumonia, intraperitoneal free air.  Patient will also get a CT dissection study to evaluate for PE, aortic syndrome, pancreatitis, other sources of possible sepsis.  Will give 30 mL/kg IV fluid bolus and broad-spectrum antibiotics.  Will give fentanyl  for pain control.  Will give thiamine .  Other than tachycardia, no clinical signs of withdrawal.  Will continue to monitor closely.   MEDICATIONS GIVEN IN ED: Medications  lactated ringers  infusion (has no administration in time range)  metroNIDAZOLE  (FLAGYL ) IVPB 500 mg (500 mg Intravenous New Bag/Given 02/06/24 0702)  ondansetron  (ZOFRAN ) injection 4 mg (has no administration in time range)  vancomycin  (VANCOREADY) IVPB 1500 mg/300 mL (has no administration in time range)  thiamine  (VITAMIN B1) injection 100 mg (has no administration in time range)  pantoprazole  (PROTONIX ) injection 80 mg (has no administration in time range)  lactated ringers  bolus 1,000 mL (1,000 mLs Intravenous New Bag/Given 02/06/24 0613)    And  lactated ringers  bolus 1,000 mL (1,000  mLs Intravenous New Bag/Given 02/06/24 0705)  ceFEPIme  (MAXIPIME ) 2 g in sodium chloride  0.9 % 100 mL IVPB (0 g Intravenous Stopped 02/06/24 0702)  fentaNYL  (SUBLIMAZE ) injection 100 mcg (100 mcg Intravenous Given 02/06/24 0643)  iohexol  (OMNIPAQUE ) 350 MG/ML injection 100 mL (100 mLs Intravenous Contrast Given 02/06/24 0649)     ED COURSE: Patient's labs show leukocytosis of 12.1.  Normal hemoglobin.  Normal LFTs, lipase.  Lactic elevated at 3.2.  Alcohol level of 47.  CT scan, x-rays reviewed  by myself and the radiologist and shows intraperitoneal free air.  No dissection, ruptured aneurysm.  Will discuss with general surgery.  Blood pressure is improving with IV hydration.  Patient still tachycardic.   CONSULTS:  7:14 AM  Spoke with general surgery, Dr. Cesar.  7:30 AM  Dr. Cesar has seen patient and will take urgently to the operating room.  Patient's blood pressure has improved.  He is currently stable.  Surgery requests medicine admission due to patient's chronic medical history of alcohol use disorder, diabetes.  Will reach out to the hospitalist.  7:38 AM  Consulted and discussed patient's case with hospitalist, Dr. Laurita.  I have recommended admission and consulting physician agrees and will place admission orders.  Patient (and family if present) agree with this plan.   I reviewed all nursing notes, vitals, pertinent previous records.  All labs, EKGs, imaging ordered have been independently reviewed and interpreted by myself.    OUTSIDE RECORDS REVIEWED: Reviewed family medicine note in 2021.       FINAL CLINICAL IMPRESSION(S) / ED DIAGNOSES   Final diagnoses:  Sudden onset of severe abdominal pain  Free intraperitoneal air  Septic shock (HCC)  Alcohol use disorder     Rx / DC Orders   ED Discharge Orders     None        Note:  This document was prepared using Dragon voice recognition software and may include unintentional dictation errors.   Jakiera Ehler, Josette SAILOR, DO 02/06/24 [phone]

## 2024-02-06 NOTE — Consult Note (Signed)
 [facility]-General Surgery  SURGICAL CONSULTATION NOTE    HISTORY OF PRESENT ILLNESS (HPI):  48 y.o. male presented to [facility] ED today for evaluation of sudden onset upper abdominal pain. Patient states the pain started this morning. It is mainly epigastric but radiates to other areas of abdomen. Does not report any aggravating or alleviating factors. Denies any vomiting, diarrhea, melena or hematochezia. Patient has a surgical history of roux-en-Y gastric bypass about 10 years ago and cholecystectomy.   In the ED, patient was hypotensive with a BP of 79/57, tachycardiac with HR of 133, afebrile, and RR of 18. Labs showed leukocytosis with a WBC of 12.1 and normal Hbg of 16.2. Patient had a creatinine of 0.69, normal LFTs, normal total bilirubin of 0.8, and normal lipase of 29. Had a lactic acid of 3.2. Was initiated on sepsis management protocol. BP improving with IV fluids. An alcohol level of 47 with no clinical signs of withdrawal. Imaging was obtained to rule out sources of possible sepsis. CT angio chest/abdomen/ pelvis showed scattered free air in the upper abdomen consistent with perforated viscus.   Surgery is consulted by Dr. Neomi in this context for evaluation and management of bowel perforation.   PAST MEDICAL HISTORY (PMH):  Past Medical History:  Diagnosis Date   Diabetes mellitus without complication (HCC)    Neuropathy      PAST SURGICAL HISTORY (PSH):  Past Surgical History:  Procedure Laterality Date   BARIATRIC SURGERY     CHOLECYSTECTOMY     GASTRIC BYPASS       MEDICATIONS:  Prior to Admission medications   Medication Sig Start Date End Date Taking? Authorizing Provider  cephALEXin  (KEFLEX ) 500 MG capsule Take 1 capsule (500 mg total) by mouth 4 (four) times daily. Patient not taking: Reported on 02/06/2024 12/20/23   Robinson, John K, PA-C  chlordiazePOXIDE  (LIBRIUM ) 25 MG capsule 50mg  PO TID x 1D, then 25-50mg  PO BID X 1D, then 25-50mg  PO QD X 1D Patient not  taking: Reported on 02/06/2024 12/20/23   Lang Norleen POUR, PA-C  gabapentin  (NEURONTIN ) 400 MG capsule Take 400 mg by mouth 4 (four) times daily as needed. 11/27/23   [provider]  metFORMIN (GLUCOPHAGE) 1000 MG tablet Take 1 tablet by mouth 2 (two) times daily. 03/01/13   [provider]  predniSONE  (DELTASONE ) 20 MG tablet Take one tab by mouth twice daily for 4 days, then one daily. Take with food. Patient not taking: Reported on 12/20/2023 08/10/19   Pauline Garnette LABOR, MD     ALLERGIES:  Allergies  Allergen Reactions   Ibuprofen Other (See Comments)    Was told not to take because of the bariatric surgery he had   Morphine And Codeine Other (See Comments)    Headache and burning in throat  Has had vicodin before w/o issues.     SOCIAL HISTORY:  Social History   Socioeconomic History   Marital status: Married    Spouse name: Not on file   Number of children: Not on file   Years of education: Not on file   Highest education level: Not on file  Occupational History   Not on file  Tobacco Use   Smoking status: Every Day    Current packs/day: 1.00    Average packs/day: 1 pack/day for 25.0 years (25.0 ttl pk-yrs)    Types: Cigarettes   Smokeless tobacco: Never   Tobacco comments:    Is taking something to help quit but doesn't remember what  Vaping Use   Vaping status: Never Used  Substance and Sexual Activity   Alcohol use: Yes    Alcohol/week: 13.0 standard drinks of alcohol    Types: 1 Glasses of wine, 12 Cans of beer per week   Drug use: No   Sexual activity: Yes  Other Topics Concern   Not on file  Social History Narrative   Not on file   Social Drivers of Health   Financial Resource Strain: Not on file  Food Insecurity: Not on file  Transportation Needs: Not on file  Physical Activity: Not on file  Stress: Not on file  Social Connections: Unknown (12/04/2021)   Received from [facility]   Social Network    Social Network: Not on file   Intimate Partner Violence: Unknown (10/27/2021)   Received from [facility]   HITS    Physically Hurt: Not on file    Insult or Talk Down To: Not on file    Threaten Physical Harm: Not on file    Scream or Curse: Not on file     FAMILY HISTORY:  Family History  Problem Relation Age of Onset   Healthy Mother       REVIEW OF SYSTEMS:  Review of Systems  Constitutional:  Negative for chills and fever.  Respiratory:  Negative for cough and wheezing.   Cardiovascular:  Negative for chest pain and palpitations.  Gastrointestinal:  Positive for abdominal pain. Negative for blood in stool, nausea and vomiting.    VITAL SIGNS:  Temp:  [97.7 F (36.5 C)-99.2 F (37.3 C)] 99.2 F (37.3 C) (07/14 0615) Pulse Rate:  [113-133] 130 (07/14 0715) Resp:  [14-27] 14 (07/14 0715) BP: (79-128)/(57-74) 128/74 (07/14 0700) SpO2:  [93 %-99 %] 96 % (07/14 0715) Weight:  [65.8 kg] 65.8 kg (07/14 0534)     Height: 5' 9 (175.3 cm) Weight: 65.8 kg BMI (Calculated): 21.4   INTAKE/OUTPUT:  No intake/output data recorded.  PHYSICAL EXAM:  Physical Exam Constitutional:      Appearance: He is well-developed.  HENT:     Head: Normocephalic and atraumatic.  Cardiovascular:     Rate and Rhythm: Regular rhythm. Tachycardia present.  Pulmonary:     Effort: Pulmonary effort is normal.     Breath sounds: Normal breath sounds.  Abdominal:     General: There is no distension.     Palpations: Abdomen is soft.     Tenderness: There is abdominal tenderness in the epigastric area. There is no guarding or rebound.  Neurological:     Mental Status: He is alert.     Labs:     Latest Ref Rng & Units 02/06/2024    5:42 AM 12/20/2023    2:48 PM 02/21/2023   10:32 AM  CBC  WBC 4.0 - 10.5 K/uL 12.1  11.5  8.3   Hemoglobin 13.0 - 17.0 g/dL 83.7  84.2  83.8   Hematocrit 39.0 - 52.0 % 47.4  46.2  47.3   Platelets 150 - 400 K/uL 343  299  284       Latest Ref Rng & Units 02/06/2024    5:42 AM 12/20/2023     2:48 PM 02/21/2023    1:30 PM  CMP  Glucose 70 - 99 mg/dL 844  805    BUN 6 - 20 mg/dL 8  7    Creatinine 9.38 - 1.24 mg/dL 9.30  9.22    Sodium 864 - 145 mmol/L 139  133    Potassium  3.5 - 5.1 mmol/L 4.0  4.6    Chloride 98 - 111 mmol/L 104  97    CO2 22 - 32 mmol/L 21  26    Calcium 8.9 - 10.3 mg/dL 8.9  9.2    Total Protein 6.5 - 8.1 g/dL 6.3  6.9  7.0   Total Bilirubin 0.0 - 1.2 mg/dL 0.8  0.7  1.2   Alkaline Phos 38 - 126 U/L 96  79  124   AST 15 - 41 U/L 23  27  30    ALT 0 - 44 U/L 34  26  54     Imaging studies:  CLINICAL DATA:  Severe upper abdominal pain, tachycardia, hypotension. Query pancreatitis or acute aortic syndrome. Possible sepsis.   EXAM: CT ANGIOGRAPHY CHEST, ABDOMEN AND PELVIS   TECHNIQUE: Non-contrast CT of the chest was initially obtained.   Multidetector CT imaging through the chest, abdomen and pelvis was performed using the standard protocol during bolus administration of intravenous contrast. Multiplanar reconstructed images and MIPs were obtained and reviewed to evaluate the vascular anatomy.   RADIATION DOSE REDUCTION: This exam was performed according to the departmental dose-optimization program which includes automated exposure control, adjustment of the mA and/or kV according to patient size and/or use of iterative reconstruction technique.   CONTRAST:  OMNIPAQUE  IOHEXOL  350 MG/ML SOLN   COMPARISON:  No prior cross-sectional imaging for comparison. Last chest series PA and lateral 02/21/2023, before that PA chest and rib films 05/30/2019. A portable chest and abdomen x-ray are ordered but not yet performed.   FINDINGS: CTA CHEST FINDINGS   Cardiovascular: The cardiac size is normal. No pericardial effusion. No coronary artery calcifications are seen.   The aorta and great vessels are normal without visible plaque disease, aneurysm, stenosis or dissection.   There is a normal variant 4 vessel aortic arch with arch origin  of left vertebral artery.   The pulmonary arteries are adequately seen at least to the segmental level with no central embolus on this nontailored study. Pulmonary veins are normal caliber.   Mediastinum/Nodes: No enlarged mediastinal, hilar, or axillary lymph nodes. Thyroid gland, trachea, and esophagus demonstrate no significant findings.   Lungs/Pleura: There are calcified granulomas in the right upper and middle lobes.   There is diffuse bronchial thickening. There is no consolidation, effusion, nodules or pneumothorax.   Musculoskeletal: There are degenerative disc changes and mild endplate irregularities in the lower thoracic spine with mild spondylosis.   No acute or other significant osseous findings. The ribcage is intact. No chest wall mass.   Review of the MIP images confirms the above findings.   CTA ABDOMEN AND PELVIS FINDINGS   VASCULAR   Aorta: Normal.   Celiac: Normal.   SMA: Normal.   Renals: Normal.   IMA: Normal.   Inflow: Patent without evidence of aneurysm, dissection, vasculitis or significant stenosis. There beginning calcific plaques in the common iliac and internal iliac arteries. The external iliac arteries are plaque free.   Veins: No obvious venous abnormality within the limitations of this arterial phase study.   Review of the MIP images confirms the above findings.   NON-VASCULAR   Hepatobiliary: The liver moderately steatotic, 17 cm length. No mass enhancement. Surgically absent gallbladder without bile duct dilatation.   Pancreas: Unremarkable. No pancreatic ductal dilatation or surrounding inflammatory changes.   Spleen: No mass.  No splenomegaly.   Adrenals/Urinary Tract: There is no adrenal mass. Exophytic low-density lesion extends off the lower lateral left  kidney measuring 1.9 cm and 52 Hounsfield units, above the usual density of fluid.   Follow-up ultrasound recommended to assess for a proteinaceous  or hemorrhagic cyst or solid lesion. Index of suspicion is low given homogeneity but follow-up is needed.   Remainder of the kidneys are unremarkable. There is no urinary stone or obstruction. The bladder is unremarkable.   Stomach/Bowel: Remote Roux-en-Y gastric bypass, with small bowel to small bowel anastomosis in the anterior left mid abdomen.   There is scattered free air the upper abdomen, which is almost all in the left upper abdomen with trace amount in the pre hepatic space to the left.   Findings consistent with a perforated hollow viscus. I am not certain where this is coming from, but there is potential for a small rent in the dorsal wall of the first post anastomotic jejunal segment just distal to the gastric anastomosis, equivocally seen on series 5 axial 157-161.   The small intestine is diffusely thickened, unknown if this is congestive thickening, diffuse enteritis or reactive thickening due to the leaked out fluid in the abdomen and pelvis.   In the right mid abdomen in the subhepatic space, there is a 4 cm in length small-bowel intussusception on coronal series 8, image 67 and on series 5 axial images 205-223. There is no wall pneumatosis or upstream dilatation.   The appendix is normal in caliber. There is no evidence of a small-bowel obstruction. Colon is mostly empty and contracted.   The mesentery is diffusely edematous which could be due to peritonitis or congestion.   Lymphatic: There is mesenteric root adenopathy, multiple lymph nodes from borderline size up to 1.1 cm in short axis. No pelvic adenopathy.   Reproductive: Normal prostate.   Other: In addition to the free air there is scattered free fluid in the abdomen, in the mesenteric folds and a small amount collecting in the pelvis. No large drainable pocket.   Musculoskeletal: Mild features of erosive sacroiliitis. No acute or other significant osseous findings.   Review of the MIP images  confirms the above findings.   IMPRESSION: 1. Scattered free air in the upper abdomen consistent with a perforated hollow viscus. I am not certain where this is coming from, but there is potential for a small rent in the dorsal wall of the first post anastomotic jejunal segment just distal to the gastric anastomosis. 2. Scattered free fluid in the abdomen and pelvis. No large drainable pocket. 3. Diffuse small bowel thickening which could be congestive thickening, diffuse enteritis or reactive thickening due to the leaked out fluid in the abdomen and pelvis. 4. 4 cm in length small-bowel intussusception in the right mid abdomen in the subhepatic space. No wall pneumatosis or upstream dilatation. 5. Mesenteric root adenopathy.  Follow-up recommended. 6. Diffuse mesenteric edema which could be congestive or peritonitis. 7. 1.9 cm exophytic low-density lesion off the lower lateral left kidney. Follow-up ultrasound recommended to assess for a proteinaceous or hemorrhagic cyst or solid lesion. Index of suspicion is low given homogeneity but follow-up is needed as it is indeterminate due to density. 8. No evidence of aortic aneurysm or dissection. Negative CTA except for minimal iliac calcific plaques. 9. No CT evidence of acute pancreatitis. 10. Diffuse bronchial thickening. 11. Remote Roux-en-Y gastric bypass. 12. Mild features of erosive sacroiliitis. 13. Critical Value/emergent results were called by telephone at the time of interpretation on 02/06/2024 at 7:08 am to provider [facility] , who verbally acknowledged these results.  Electronically Signed   By: Francis Quam M.D.   On: 02/06/2024 07:42   Assessment/Plan: 48 y.o. male with bowel perforation, complicated by pertinent comorbidities including alcohol use disorder, diabetes, neuropathy, and surgical history of Roux-en-Y gastric bypass and cholecystectomy    - No fever, tachycardiac, normotensive with a BP of   128/74  - Discussed the possible etiologies of perorated bowel such as gastric ulcer vs diverticula vs history of gastric bypass. Patient will be taken to the OR this morning for exploratory laparotomy. Informed of the possibility of colostomy bag. Patient agrees with surgery.   - Ordered NG tube and foley catheter  - DVT prophylaxis   Thank you for the opportunity to participate in this patient's care.   -- Gilmer Cea PA-C

## 2024-02-06 NOTE — Transfer of Care (Signed)
 Immediate Anesthesia Transfer of Care Note  Patient: Christian Mathis  Procedure(s) Performed: LAPAROTOMY, EXPLORATORY EXCISION, SMALL INTESTINE  Patient Location: PACU  Anesthesia Type:General  Level of Consciousness: drowsy and patient cooperative  Airway & Oxygen Therapy: Patient Spontanous Breathing and Patient connected to face mask oxygen  Post-op Assessment: Report given to RN and Post -op Vital signs reviewed and stable  Post vital signs: Reviewed and stable  Last Vitals:  Vitals Value Taken Time  BP 127/82 02/06/24 13:16  Temp    Pulse 96 02/06/24 13:21  Resp 14 02/06/24 13:21  SpO2 97 % 02/06/24 13:21  Vitals shown include unfiled device data.  Last Pain:  Vitals:   02/06/24 0905  TempSrc: Temporal  PainSc:          Complications: No notable events documented.

## 2024-02-06 NOTE — Progress Notes (Signed)
 CODE SEPSIS - PHARMACY COMMUNICATION  **Broad Spectrum Antibiotics should be administered within 1 hour of Sepsis diagnosis**  Time Code Sepsis Called/Page Received: 9445  Antibiotics Ordered: Cefepime , Flagyl , Vancomycin   Time of 1st antibiotic administration: 9376  [place] CANDIE Dills, PharmD, [facility] 02/06/2024 5:54 AM

## 2024-02-06 NOTE — Op Note (Signed)
 Preoperative diagnosis: Perforation of intestine.   Postoperative diagnosis: Same  Procedure: Exploratory laparotomy with small bowel resection.                      Feeding jejunostomy creation  Anesthesia: GETA  Surgeon: Dr. Rodolph, MD  Assistant: Gilmer Luane Hong, [place]  Wound Classification: Clean Contaminated  Indications: Patient is a 48 y.o. male with signs and symptoms of epigastric pain was found with perforation of intestine. Small bowel resection indicated for management of small bowel perforation.   Findings: Perforation in the short Intestinal Roux Limb Normal Gastro-Jejunostomy and Jejuno-Jejunostomy anastomosis.    Description of procedure: The patient was placed in the supine position and general endotracheal anesthesia was induced. A timeout was completed verifying correct patient, procedure, site, positioning, and implant(s) and/or special equipment prior to beginning this procedure. Preoperative antibiotics were given. A Foley catheter was placed. The abdomen was prepped and draped in the usual sterile fashion. After a timeout was performed, a vertical midline incision was made from xiphoid to just below the umbilicus. This was deepened through the subcutaneous tissues and hemostasis was achieved with electrocautery. The linea alba was identified and incised and the peritoneal cavity entered. The abdomen was explored, and upon entering the abdomen (organ space), I encountered purulent peritonitis.  Purulent peritonitis was suction.  Every difficult and time-consuming lysis of the skin of the gastrojejunostomy was found to be able to inspect the gastrojejunostomy.  No complication was seen on the gastrojejunostomy.  At the last evaluation was done distally inspecting the jejunojejunostomy.  Patent, noncomplicated jejunojejunostomy was identified.  Upon inspection of the short intestinal Roux limb, a perforation was found on the posterior portion of the jejunum.   The distal common channel was inspected without any complications.  The ascending, transverse, descending and sigmoid colon was also inspected.  I decided to proceed with small bowel resection of the intestine with perforation.  A small segment of the jejunum was transected with GIA.  The bowel was divided with a cutting linear stapler at each resection margin and passed off the table as a specimen. The 2 segments were then approximated with two sutures of 3-0 silk placed approximately 5 cm apart. Enterotomies were made at the antimesenteric borders and the cutting linear stapler inserted and fired. The lumen was inspected for hemostasis. The enterotomies were closed with a running 2-0 STRATAFIX.  The anastomosis was then inspected for patency and integrity. The abdomen was irrigated with 3 L of saline.  A nasogastric tube was placed under direct visualization and left at the proximal stomach pouch.  The remaining small bowel appeared viable.  I decided to proceed to do a feeding jejunostomy distal to the jejunojejunostomy anastomosis.  A 16 French red rubber was inserted through the left side of the abdominal wall.  A pursestring was placed 10 cm distal to the jejunojejunostomy.  A small enterotomy was performed.  The red rubber was inserted through the opening.  Then a serosal tunnel was done with interrupted 3-0 silks.  This was fixed to the anterior abdominal wall at the left side of the abdomen.  A 15 French drain was placed close to the anastomosis and on the left side of the upper abdominal cavity.  After the sponge needle and instrument count was correct, the fascia was closed with a running suture of  PDS 0. The skin was closed with skin staples.  A- pressure dressing (DME) was place on the incision in layers  covering 20 x 2 cm (440 cm).  The patient tolerated the procedure well and was taken to the postanesthesia care unit in stable condition.   Specimen: Small bowel  Complications:  None  Estimated Blood Loss: 50 mL

## 2024-02-06 NOTE — H&P (Signed)
 History and Physical    Christian Mathis FMW:[id number] DOB: 1975/10/13 DOA: 02/06/2024  PCP: [facility] (Confirm with patient/family/NH records and if not entered, this has to be entered at [facility] point of entry) Patient coming from: [place]  I have personally briefly reviewed patient's old medical records in [facility], [facility], [facility]  Chief Complaint: Severe abd pain  HPI: Christian Mathis is a 48 y.o. male with medical history significant of IIDM, diabetic neuropathy, status post Roux-en-Y gastric bypass, presented with severe sudden onset of abdominal pain.  Patient woke up this morning about 2 AM with severe sharp like abdominal pain 10/10,  all over belly, constant.  Denies any trauma yesterday.  Drinks a beer every day and last drink was last night. ED Course: Temperature 99.2 heart rate 130s, blood pressure 79/57 and responded to 2 L IV bolus with exertion 96% on room air.  CTA chest abdomen pelvis showed signs of bowel perforation and free air in peritoneal cavity, blood work showed WBC 12.1 hemoglobin 16 BUN 8 creatinine 0.6 glucose 155 bicarb 21.  Patient was given IV bolus x 2, vancomycin  and cefepime  and Flagyl  in the ED.  Review of Systems: As per HPI otherwise 14 point review of systems negative.    Past Medical History:  Diagnosis Date   Diabetes mellitus without complication (HCC)    Neuropathy     Past Surgical History:  Procedure Laterality Date   BARIATRIC SURGERY     CHOLECYSTECTOMY     GASTRIC BYPASS       reports that he has been smoking cigarettes. He has a 25 pack-year smoking history. He has never used smokeless tobacco. He reports current alcohol use of about 13.0 standard drinks of alcohol per week. He reports that he does not use drugs.  Allergies  Allergen Reactions   Ibuprofen Other (See Comments)    Was told not to take because of the bariatric surgery he had   Morphine And Codeine Other (See Comments)    Headache and burning  in throat  Has had vicodin before w/o issues.    Family History  Problem Relation Age of Onset   Healthy Mother      Prior to Admission medications   Medication Sig Start Date End Date Taking? Authorizing Provider  cephALEXin  (KEFLEX ) 500 MG capsule Take 1 capsule (500 mg total) by mouth 4 (four) times daily. Patient not taking: Reported on 02/06/2024 12/20/23   Robinson, John K, PA-C  chlordiazePOXIDE  (LIBRIUM ) 25 MG capsule 50mg  PO TID x 1D, then 25-50mg  PO BID X 1D, then 25-50mg  PO QD X 1D Patient not taking: Reported on 02/06/2024 12/20/23   Robinson, John K, PA-C  gabapentin  (NEURONTIN ) 400 MG capsule Take 400 mg by mouth 4 (four) times daily as needed. 11/27/23   [provider]  metFORMIN (GLUCOPHAGE) 1000 MG tablet Take 1 tablet by mouth 2 (two) times daily. 03/01/13   [provider]  predniSONE  (DELTASONE ) 20 MG tablet Take one tab by mouth twice daily for 4 days, then one daily. Take with food. Patient not taking: Reported on 12/20/2023 08/10/19   Pauline Garnette LABOR, MD    Physical Exam: Vitals:   02/06/24 0615 02/06/24 0700 02/06/24 0715 02/06/24 0730  BP: 90/72 128/74  116/73  Pulse: (!) 113 (!) 119 (!) 130 (!) 130  Resp:  (!) 27 14 18   Temp: 99.2 F (37.3 C)     TempSrc: Rectal     SpO2:  93% 96% 94%  Weight:      Height:        Constitutional: NAD, calm, comfortable Vitals:   02/06/24 0615 02/06/24 0700 02/06/24 0715 02/06/24 0730  BP: 90/72 128/74  116/73  Pulse: (!) 113 (!) 119 (!) 130 (!) 130  Resp:  (!) 27 14 18   Temp: 99.2 F (37.3 C)     TempSrc: Rectal     SpO2:  93% 96% 94%  Weight:      Height:       Eyes: PERRL, lids and conjunctivae normal ENMT: Mucous membranes are moist. Posterior pharynx clear of any exudate or lesions.Normal dentition.  Neck: normal, supple, no masses, no thyromegaly Respiratory: clear to auscultation bilaterally, no wheezing, no crackles. Normal respiratory effort. No accessory muscle use.  Cardiovascular:  Regular rate and rhythm, no murmurs / rubs / gallops. No extremity edema. 2+ pedal pulses. No carotid bruits.  Abdomen: Severe tenderness with guarding, no masses palpated. No hepatosplenomegaly. Bowel sounds positive.  Musculoskeletal: no clubbing / cyanosis. No joint deformity upper and lower extremities. Good ROM, no contractures. Normal muscle tone.  Skin: no rashes, lesions, ulcers. No induration Neurologic: CN 2-12 grossly intact. Sensation intact, DTR normal. Strength 5/5 in all 4.  Psychiatric: Normal judgment and insight. Alert and oriented x 3. Normal mood.     Labs on Admission: I have personally reviewed following labs and imaging studies  CBC: Recent Labs  Lab 02/06/24 0542  WBC 12.1*  HGB 16.2  HCT 47.4  MCV 86.8  PLT 343   Basic Metabolic Panel: Recent Labs  Lab 02/06/24 0542  NA 139  K 4.0  CL 104  CO2 21*  GLUCOSE 155*  BUN 8  CREATININE 0.69  CALCIUM 8.9   GFR: Estimated Creatinine Clearance: 104 mL/min (by C-G formula based on SCr of 0.69 mg/dL). Liver Function Tests: Recent Labs  Lab 02/06/24 0542  AST 23  ALT 34  ALKPHOS 96  BILITOT 0.8  PROT 6.3*  ALBUMIN  3.5   Recent Labs  Lab 02/06/24 0542  LIPASE 29   No results for input(s): AMMONIA in the last 168 hours. Coagulation Profile: Recent Labs  Lab 02/06/24 0627  INR 1.0   Cardiac Enzymes: No results for input(s): CKTOTAL, CKMB, CKMBINDEX, TROPONINI in the last 168 hours. BNP (last 3 results) No results for input(s): PROBNP in the last 8760 hours. HbA1C: No results for input(s): HGBA1C in the last 72 hours. CBG: No results for input(s): GLUCAP in the last 168 hours. Lipid Profile: No results for input(s): CHOL, HDL, LDLCALC, TRIG, CHOLHDL, LDLDIRECT in the last 72 hours. Thyroid Function Tests: No results for input(s): TSH, T4TOTAL, FREET4, T3FREE, THYROIDAB in the last 72 hours. Anemia Panel: No results for input(s): VITAMINB12,  FOLATE, FERRITIN, TIBC, IRON, RETICCTPCT in the last 72 hours. Urine analysis:    Component Value Date/Time   COLORURINE YELLOW 12/20/2023 2212   APPEARANCEUR CLEAR 12/20/2023 2212   LABSPEC 1.014 12/20/2023 2212   PHURINE 6.0 12/20/2023 2212   GLUCOSEU NEGATIVE 12/20/2023 2212   HGBUR NEGATIVE 12/20/2023 2212   BILIRUBINUR NEGATIVE 12/20/2023 2212   KETONESUR 5 (A) 12/20/2023 2212   PROTEINUR NEGATIVE 12/20/2023 2212   NITRITE NEGATIVE 12/20/2023 2212   LEUKOCYTESUR NEGATIVE 12/20/2023 2212    Radiological Exams on Admission: CT Angio Chest/Abd/Pel for Dissection W and/or Wo Contrast Result Date: 02/06/2024 CLINICAL DATA:  Severe upper abdominal pain, tachycardia, hypotension. Query pancreatitis or acute aortic syndrome. Possible sepsis. EXAM: CT ANGIOGRAPHY CHEST, ABDOMEN AND  PELVIS TECHNIQUE: Non-contrast CT of the chest was initially obtained. Multidetector CT imaging through the chest, abdomen and pelvis was performed using the standard protocol during bolus administration of intravenous contrast. Multiplanar reconstructed images and MIPs were obtained and reviewed to evaluate the vascular anatomy. RADIATION DOSE REDUCTION: This exam was performed according to the departmental dose-optimization program which includes automated exposure control, adjustment of the mA and/or kV according to patient size and/or use of iterative reconstruction technique. CONTRAST:  OMNIPAQUE  IOHEXOL  350 MG/ML SOLN COMPARISON:  No prior cross-sectional imaging for comparison. Last chest series PA and lateral 02/21/2023, before that PA chest and rib films 05/30/2019. A portable chest and abdomen x-ray are ordered but not yet performed. FINDINGS: CTA CHEST FINDINGS Cardiovascular: The cardiac size is normal. No pericardial effusion. No coronary artery calcifications are seen. The aorta and great vessels are normal without visible plaque disease, aneurysm, stenosis or dissection. There is a normal  variant 4 vessel aortic arch with arch origin of left vertebral artery. The pulmonary arteries are adequately seen at least to the segmental level with no central embolus on this nontailored study. Pulmonary veins are normal caliber. Mediastinum/Nodes: No enlarged mediastinal, hilar, or axillary lymph nodes. Thyroid gland, trachea, and esophagus demonstrate no significant findings. Lungs/Pleura: There are calcified granulomas in the right upper and middle lobes. There is diffuse bronchial thickening. There is no consolidation, effusion, nodules or pneumothorax. Musculoskeletal: There are degenerative disc changes and mild endplate irregularities in the lower thoracic spine with mild spondylosis. No acute or other significant osseous findings. The ribcage is intact. No chest wall mass. Review of the MIP images confirms the above findings. CTA ABDOMEN AND PELVIS FINDINGS VASCULAR Aorta: Normal. Celiac: Normal. SMA: Normal. Renals: Normal. IMA: Normal. Inflow: Patent without evidence of aneurysm, dissection, vasculitis or significant stenosis. There beginning calcific plaques in the common iliac and internal iliac arteries. The external iliac arteries are plaque free. Veins: No obvious venous abnormality within the limitations of this arterial phase study. Review of the MIP images confirms the above findings. NON-VASCULAR Hepatobiliary: The liver moderately steatotic, 17 cm length. No mass enhancement. Surgically absent gallbladder without bile duct dilatation. Pancreas: Unremarkable. No pancreatic ductal dilatation or surrounding inflammatory changes. Spleen: No mass.  No splenomegaly. Adrenals/Urinary Tract: There is no adrenal mass. Exophytic low-density lesion extends off the lower lateral left kidney measuring 1.9 cm and 52 Hounsfield units, above the usual density of fluid. Follow-up ultrasound recommended to assess for a proteinaceous or hemorrhagic cyst or solid lesion. Index of suspicion is low given  homogeneity but follow-up is needed. Remainder of the kidneys are unremarkable. There is no urinary stone or obstruction. The bladder is unremarkable. Stomach/Bowel: Remote Roux-en-Y gastric bypass, with small bowel to small bowel anastomosis in the anterior left mid abdomen. There is scattered free air the upper abdomen, which is almost all in the left upper abdomen with trace amount in the pre hepatic space to the left. Findings consistent with a perforated hollow viscus. I am not certain where this is coming from, but there is potential for a small rent in the dorsal wall of the first post anastomotic jejunal segment just distal to the gastric anastomosis, equivocally seen on series 5 axial 157-161. The small intestine is diffusely thickened, unknown if this is congestive thickening, diffuse enteritis or reactive thickening due to the leaked out fluid in the abdomen and pelvis. In the right mid abdomen in the subhepatic space, there is a 4 cm in length small-bowel intussusception on  coronal series 8, image 67 and on series 5 axial images 205-223. There is no wall pneumatosis or upstream dilatation. The appendix is normal in caliber. There is no evidence of a small-bowel obstruction. Colon is mostly empty and contracted. The mesentery is diffusely edematous which could be due to peritonitis or congestion. Lymphatic: There is mesenteric root adenopathy, multiple lymph nodes from borderline size up to 1.1 cm in short axis. No pelvic adenopathy. Reproductive: Normal prostate. Other: In addition to the free air there is scattered free fluid in the abdomen, in the mesenteric folds and a small amount collecting in the pelvis. No large drainable pocket. Musculoskeletal: Mild features of erosive sacroiliitis. No acute or other significant osseous findings. Review of the MIP images confirms the above findings. IMPRESSION: 1. Scattered free air in the upper abdomen consistent with a perforated hollow viscus. I am not  certain where this is coming from, but there is potential for a small rent in the dorsal wall of the first post anastomotic jejunal segment just distal to the gastric anastomosis. 2. Scattered free fluid in the abdomen and pelvis. No large drainable pocket. 3. Diffuse small bowel thickening which could be congestive thickening, diffuse enteritis or reactive thickening due to the leaked out fluid in the abdomen and pelvis. 4. 4 cm in length small-bowel intussusception in the right mid abdomen in the subhepatic space. No wall pneumatosis or upstream dilatation. 5. Mesenteric root adenopathy.  Follow-up recommended. 6. Diffuse mesenteric edema which could be congestive or peritonitis. 7. 1.9 cm exophytic low-density lesion off the lower lateral left kidney. Follow-up ultrasound recommended to assess for a proteinaceous or hemorrhagic cyst or solid lesion. Index of suspicion is low given homogeneity but follow-up is needed as it is indeterminate due to density. 8. No evidence of aortic aneurysm or dissection. Negative CTA except for minimal iliac calcific plaques. 9. No CT evidence of acute pancreatitis. 10. Diffuse bronchial thickening. 11. Remote Roux-en-Y gastric bypass. 12. Mild features of erosive sacroiliitis. 13. Critical Value/emergent results were called by telephone at the time of interpretation on 02/06/2024 at 7:08 am to provider [facility] , who verbally acknowledged these results. Electronically Signed   By: Francis Quam M.D.   On: 02/06/2024 07:42   DG Abd 2 Views Result Date: 02/06/2024 CLINICAL DATA:  Sepsis.  Evaluate for free air. EXAM: ABDOMEN - 2 VIEW COMPARISON:  None FINDINGS: Right upper quadrant cholecystectomy clips. Contrast material is identified within the collecting systems, ureters and urinary bladder. Bowel gas pattern is nonspecific. No pathologic dilatation of the large or small bowel loops. Pneumoperitoneum on the CTA obtained at 6:51 a.m. is suboptimally visualized by plain film  technique. Visualized osseous structures appear grossly intact. IMPRESSION: 1. Nonspecific bowel gas pattern. No signs of high-grade bowel obstruction. 2. Pneumoperitoneum on the CTA obtained at 6:51 AM is suboptimally visualized by plain film technique. Electronically Signed   By: Waddell Calk M.D.   On: 02/06/2024 07:26   DG Chest Port 1 View Result Date: 02/06/2024 CLINICAL DATA:  Questionable sepsis.  Evaluate for free air. EXAM: PORTABLE CHEST 1 VIEW COMPARISON:  02/21/2023 FINDINGS: The heart size and mediastinal contours are within normal limits. Both lungs are clear. The visualized skeletal structures are unremarkable. IMPRESSION: No active disease. Electronically Signed   By: Waddell Calk M.D.   On: 02/06/2024 07:22    EKG: Independently reviewed.  Sinus tachycardia, no acute ST changes, similar QRS changes in lead V1-V3  Assessment/Plan Principal Problem:   Bowel perforation (  HCC)  (please populate well all problems here in Problem List. (For example, if patient is on BP meds at home and you resume or decide to hold them, it is a problem that needs to be her. Same for CAD, COPD, HLD and so on)  Sepsis without acute endorgan damage Acute peritonitis Acute bowel perforation -Sepsis evidenced by tachycardia elevated lactic acid and leukocytosis, source of infection is acute peritonitis from bowel rupture - CT abdomen pelvis showed signs of possible bowel rupture at jejunum-Roux-en-Y gastric bypass anastomosis site, there is concern about anastomosis ulcer.  Received IV bolus of Protonix , will continue Protonix  twice daily - Emergency laparoscopic exploration and bowel repair this morning - Continue antibiotics with Zosyn  - N.p.o. and IV fluid  IIDM -SSI - Hold off metformin  Diabetes neuropathy - No acute concern, resume gabapentin  once able to take p.o.  Alcohol abuse - Currently there is no symptoms signs of alcohol withdrawal - Start CIWA protocol with as needed  benzos  DVT prophylaxis: Lovenox  Code Status: Full code Family Communication: None at bedside Disposition Plan: Patient is sick with sepsis, acute peritonitis and bilateral requiring emergency surgical repair IV antibiotics n.p.o., expect more than 2 midnight hospital stay Consults called: General Surgery Admission status: Telemetry admission   Cort ONEIDA Mana MD [facility] Pager [phone]  02/06/2024, 7:49 AM

## 2024-02-06 NOTE — ED Notes (Signed)
 Report given to [facility] RN

## 2024-02-06 NOTE — Anesthesia Preprocedure Evaluation (Addendum)
 Anesthesia Evaluation  Patient identified by MRN, date of birth, ID band Patient awake    Reviewed: Allergy & Precautions, H&P , NPO status , Patient's Chart, lab work & pertinent test results  Airway Mallampati: III  TM Distance: >3 FB Neck ROM: Full    Dental no notable dental hx. (+) Caps, Missing Caps upper front teeth:   Pulmonary Current SmokerPatient did not abstain from smoking. Smoked this morning   Pulmonary exam normal breath sounds clear to auscultation       Cardiovascular negative cardio ROS Normal cardiovascular exam Rhythm:Regular Rate:Normal     Neuro/Psych negative neurological ROS  negative psych ROS   GI/Hepatic negative GI ROS, Neg liver ROS,,,  Endo/Other  diabetes    Renal/GU negative Renal ROS  negative genitourinary   Musculoskeletal negative musculoskeletal ROS (+) Arthritis ,    Abdominal   Peds negative pediatric ROS (+)  Hematology negative hematology ROS (+)   Anesthesia Other Findings Diabetes mellitus Neuropathy Harmful pattern of use of alcohol Depressive disorder Stress and adjustment disorder Smoker Bariatric surgery 06-13-13 12-20-23 alcohol withdrawal syndrome  Drinks 12 pack beer daily but only 1/2 pack yesterday, and drank milk this morning about 5am. BAT is 47 now Hx chronic wound right shin  Reproductive/Obstetrics negative OB ROS                              Anesthesia Physical Anesthesia Plan  ASA: 4  Anesthesia Plan: General ETT   Post-op Pain Management:    Induction: Intravenous, Cricoid pressure planned and Rapid sequence  PONV Risk Score and Plan:   Airway Management Planned: Oral ETT  Additional Equipment:   Intra-op Plan:   Post-operative Plan: Extubation in OR  Informed Consent: I have reviewed the patients History and Physical, chart, labs and discussed the procedure including the risks, benefits and alternatives  for the proposed anesthesia with the patient or authorized representative who has indicated his/her understanding and acceptance.     Dental Advisory Given  Plan Discussed with: Anesthesiologist, CRNA and Surgeon  Anesthesia Plan Comments: (Patient consented for risks of anesthesia including but not limited to:  - adverse reactions to medications - damage to eyes, teeth, lips or other oral mucosa - nerve damage due to positioning  - sore throat or hoarseness - Damage to heart, brain, nerves, lungs, other parts of body or loss of life  Patient voiced understanding and assent.)         Anesthesia Quick Evaluation

## 2024-02-06 NOTE — ED Triage Notes (Signed)
 Patient ambulatory to triage with complaints of severe epigastric abdominal pain that woke him up around 0230 this morning. Patient states it feels like someone punched him. Denies N/V/D. Patient does endorse drinking normally, states at least a 6 pack of beer per day. Patient appears pale/ashen in triage.

## 2024-02-07 ENCOUNTER — Encounter: Payer: Self-pay | Admitting: General Surgery

## 2024-02-07 DIAGNOSIS — G629 Polyneuropathy, unspecified: Secondary | ICD-10-CM

## 2024-02-07 DIAGNOSIS — E119 Type 2 diabetes mellitus without complications: Secondary | ICD-10-CM

## 2024-02-07 DIAGNOSIS — K659 Peritonitis, unspecified: Secondary | ICD-10-CM | POA: Diagnosis not present

## 2024-02-07 DIAGNOSIS — K631 Perforation of intestine (nontraumatic): Secondary | ICD-10-CM | POA: Diagnosis not present

## 2024-02-07 DIAGNOSIS — F1010 Alcohol abuse, uncomplicated: Secondary | ICD-10-CM

## 2024-02-07 DIAGNOSIS — J449 Chronic obstructive pulmonary disease, unspecified: Secondary | ICD-10-CM

## 2024-02-07 DIAGNOSIS — Z9884 Bariatric surgery status: Secondary | ICD-10-CM

## 2024-02-07 LAB — GLUCOSE, CAPILLARY
Glucose-Capillary: 157 mg/dL — ABNORMAL HIGH (ref 70–99)
Glucose-Capillary: 139 mg/dL — ABNORMAL HIGH (ref 70–99)
Glucose-Capillary: 114 mg/dL — ABNORMAL HIGH (ref 70–99)
Glucose-Capillary: 139 mg/dL — ABNORMAL HIGH (ref 70–99)
Glucose-Capillary: 114 mg/dL — ABNORMAL HIGH (ref 70–99)

## 2024-02-07 LAB — URINALYSIS, W/ REFLEX TO CULTURE (INFECTION SUSPECTED)
Bilirubin Urine: NEGATIVE
Glucose, UA: NEGATIVE mg/dL
Hgb urine dipstick: NEGATIVE
Ketones, ur: 5 mg/dL — AB
Leukocytes,Ua: NEGATIVE
Nitrite: NEGATIVE
Protein, ur: NEGATIVE mg/dL
Specific Gravity, Urine: 1.017 (ref 1.005–1.030)
pH: 5 (ref 5.0–8.0)

## 2024-02-07 LAB — CBC
HCT: 38.4 % — ABNORMAL LOW (ref 39.0–52.0)
Hemoglobin: 13.4 g/dL (ref 13.0–17.0)
MCH: 30.7 pg (ref 26.0–34.0)
MCHC: 34.9 g/dL (ref 30.0–36.0)
MCV: 88.1 fL (ref 80.0–100.0)
Platelets: 218 K/uL (ref 150–400)
RBC: 4.36 MIL/uL (ref 4.22–5.81)
RDW: 13.8 % (ref 11.5–15.5)
WBC: 12.7 K/uL — ABNORMAL HIGH (ref 4.0–10.5)
nRBC: 0 % (ref 0.0–0.2)

## 2024-02-07 LAB — URINE DRUG SCREEN, QUALITATIVE (ARMC ONLY)
Amphetamines, Ur Screen: NOT DETECTED
Barbiturates, Ur Screen: NOT DETECTED
Benzodiazepine, Ur Scrn: POSITIVE — AB
Cannabinoid 50 Ng, Ur ~~LOC~~: NOT DETECTED
Cocaine Metabolite,Ur ~~LOC~~: NOT DETECTED
MDMA (Ecstasy)Ur Screen: NOT DETECTED
Methadone Scn, Ur: NOT DETECTED
Opiate, Ur Screen: POSITIVE — AB
Phencyclidine (PCP) Ur S: NOT DETECTED
Tricyclic, Ur Screen: NOT DETECTED

## 2024-02-07 LAB — BASIC METABOLIC PANEL WITH GFR
Anion gap: 8 (ref 5–15)
BUN: 8 mg/dL (ref 6–20)
CO2: 25 mmol/L (ref 22–32)
Calcium: 8.5 mg/dL — ABNORMAL LOW (ref 8.9–10.3)
Chloride: 111 mmol/L (ref 98–111)
Creatinine, Ser: 0.8 mg/dL (ref 0.61–1.24)
GFR, Estimated: >60 mL/min (ref >60)
Glucose, Bld: 145 mg/dL — ABNORMAL HIGH (ref 70–99)
Potassium: 3.9 mmol/L (ref 3.5–5.1)
Sodium: 144 mmol/L (ref 135–145)

## 2024-02-07 LAB — HEMOGLOBIN A1C
Hgb A1c MFr Bld: 7.2 % — ABNORMAL HIGH (ref 4.8–5.6)
Mean Plasma Glucose: 160 mg/dL

## 2024-02-07 LAB — HIV ANTIBODY (ROUTINE TESTING W REFLEX): HIV Screen 4th Generation wRfx: NONREACTIVE

## 2024-02-07 MED ORDER — VITAL 1.5 CAL PO LIQD
1000.0000 mL | ORAL | Status: DC
  Administered 2024-02-07 – 2024-02-09 (×3): 1000 mL

## 2024-02-07 MED ORDER — PHENOL 1.4 % MT LIQD
1.0000 | OROMUCOSAL | Status: DC | PRN
  Administered 2024-02-07 – 2024-02-14 (×2): 1 via OROMUCOSAL
  Filled 2024-02-07 (×2): qty 177

## 2024-02-07 MED ORDER — LACTATED RINGERS IV SOLN: INTRAVENOUS | Status: AC

## 2024-02-07 MED ORDER — NICOTINE 21 MG/24HR TD PT24
21.0000 mg | MEDICATED_PATCH | Freq: Every day | TRANSDERMAL | Status: DC
  Administered 2024-02-07 – 2024-02-17 (×10): 21 mg via TRANSDERMAL
  Filled 2024-02-07 (×11): qty 1

## 2024-02-07 MED ORDER — FREE WATER
30.0000 mL | Status: DC
  Administered 2024-02-07 – 2024-02-09 (×13): 30 mL

## 2024-02-07 NOTE — Assessment & Plan Note (Signed)
 CBG currently within goal, A1c of 7.2. Keep holding home metformin -Continue with SSI

## 2024-02-07 NOTE — Assessment & Plan Note (Signed)
Continue with CIWA protocol

## 2024-02-07 NOTE — Progress Notes (Signed)
 I have reviewed and concur with this student's documentation.   Carola Beal, RN 02/07/2024 2:13 PM

## 2024-02-07 NOTE — Progress Notes (Signed)
 Initial Nutrition Assessment  DOCUMENTATION CODES:   Severe malnutrition in context of chronic illness  INTERVENTION:   Vital 1.5@65ml /hr- Initiate at 6ml/hr, once ok per surgery, increase by 50ml/hr q8 hours until goal rate is reached.   Free water  flushes 30ml q4 hours to maintain tube patency   Regimen provides 2340kcal/day, 105g/day protein and 1376ml/day of free water .   MVI, folic acid  and thiamine  daily   Pt at high refeed risk; recommend monitor potassium, magnesium and phosphorus labs daily until stable  Daily weights   Pt at high risk for nutrient deficiencies r/t his h/o gastric bypass surgery. Will check thiamine , B12, folate, zinc , copper  and vitamin D , A & E labs.   NUTRITION DIAGNOSIS:   Severe Malnutrition related to chronic illness (roux-en-y, COPD, etoh abuse) as evidenced by severe fat depletion, severe muscle depletion.  GOAL:   Patient will meet greater than or equal to 90% of their needs  MONITOR:   Diet advancement, Labs, Weight trends, TF tolerance, Skin, I & O's  REASON FOR ASSESSMENT:   Consult Enteral/tube feeding initiation and management  ASSESSMENT:   48 y/o male with h/o DM, etoh abuse, GERD, MDD, morbid obesity s/p rouex-en-y gastric bypass (2014), COPD, OSA, neuropathy and chronic pancreatitis who is admitted with small bowel perforation now s/p exploratory laparotomy with small bowel resection & feeding jejunostomy creation 7/14.  Met with pt and pt's son in room today. Pt reports that he is feeling better. Pt does report some soreness in his abdomen. Pt reports passing some flatus early this morning. Pt is NPO. NGT in place to LIS with output; however, pt has been eating ice chips. Trickle tube feeds initiated this morning and pt is tolerating well. Pt is at high refeed risk. RD discussed with pt the importance of adequate nutrition needed to preserve lean muscle and support post op healing. Pt is agreeable to drink chocolate Ensure  Max with diet advancement. Plan is for discharge home without tube feeds at this time.    Pt reports good appetite and oral intake at baseline. Pt with a h/o roux-en-y and reports that he eats frequent small meals. Pt reports eating wings and a cheeseburger several days pta. Pt reports daily etoh use up to a 12 pack. Pt reports that he used to drink premier protein but reports that he has not drank this or taken any of his bariatric vitamins for at least 5 years. Pt reports that he got divorced in 2017 and reports that this started a downward spiral in his health. Pt reports that at his heaviest, he weighed 310lbs. Pt reports that his UBW is around 170lbs but reports that he has been slowly losing weight over the past two years. Pt down to 134lbs in hospital today. Pt denies any pancreatitis since having his gastric bypass. RD dicussed with patient the importance of resuming his bariatric vitamin regimen and protein shakes at home and reiterated that this will be for life. Pt reports that the only vitamin lab he has had checked was his vitamin D  several years ago that returned low. RD will check bariatric vitamins labs to ensure levels are adequate to support post op healing. Pt does report weakness and worsening neuropathy in his hands and feet. Pt does not have a PCP; recommended for patient to find a PCP to follow up with his vitamin labs yearly.     Medications reviewed and include: lovenox , folic acid , insulin , MVI, nicotine , protonix , thiamine , zosyn    Labs reviewed:  K 3.9 wnl Wbc- 12.7(H) Cbgs- 139, 114, 157 x 24 hrs  AIC 7.2(H)- 7/14  Drains-   UOP-   NUTRITION - FOCUSED PHYSICAL EXAM:  Flowsheet Row Most Recent Value  Orbital Region Moderate depletion  Upper Arm Region Severe depletion  Thoracic and Lumbar Region Severe depletion  Buccal Region Moderate depletion  Temple Region Mild depletion  Clavicle Bone Region Moderate depletion  Clavicle and Acromion Bone Region  Moderate depletion  Scapular Bone Region Moderate depletion  Dorsal Hand Severe depletion  Patellar Region Severe depletion  Anterior Thigh Region Severe depletion  Posterior Calf Region Severe depletion  Edema (RD Assessment) None  Hair Reviewed  Eyes Reviewed  Mouth Reviewed  Skin Reviewed  Nails Reviewed   Diet Order:   Diet Order             Diet NPO time specified Except for: Ice Chips  Diet effective now                  EDUCATION NEEDS:   Education needs have been addressed  Skin:  Skin Assessment: Reviewed RN Assessment (incision abdomen)  Last BM:  7/14  Height:   Ht Readings from Last 1 Encounters:  02/06/24 5' 9 (1.753 m)    Weight:   Wt Readings from Last 1 Encounters:  02/07/24 60.8 kg    Ideal Body Weight:  72.7 kg  BMI:  Body mass index is 19.8 kg/m.  Estimated Nutritional Needs:   Kcal:  2000-2300kcal/day  Protein:  100-115g/day  Fluid:  2.0-2.3L/day  Augustin Shams MS, RD, LDN If unable to be reached, please send secure chat to RD inpatient available from 8:00a-4:00p daily

## 2024-02-07 NOTE — TOC CM/SW Note (Signed)
 Transition of Care [facility]) - Inpatient Brief Assessment   Patient Details  Name: Christian Mathis MRN: [id number] Date of Birth: 04-Sep-1975  Transition of Care [facility]) CM/SW Contact:    Corean ONEIDA Haddock, RN Phone Number: 02/07/2024, 3:26 PM   Clinical Narrative:    Transition of Care [facility]) Screening Note   Patient Details  Name: Christian Mathis Date of Birth: 1976-01-30   Transition of Care [facility]) CM/SW Contact:    Corean ONEIDA Haddock, RN Phone Number: 02/07/2024, 3:26 PM    Transition of Care Department [facility]) has reviewed patient and no TOC needs have been identified at this time. If new patient transition needs arise, please place a TOC consult.   Transition of Care Asessment: Insurance and Status: Insurance coverage has been reviewed Patient has primary care physician: Yes     Prior/Current Home Services: No current home services Social Drivers of Health Review: SDOH reviewed no interventions necessary Readmission risk has been reviewed: Yes Transition of care needs: no transition of care needs at this time

## 2024-02-07 NOTE — Progress Notes (Signed)
 [facility]- General Surgery  SURGICAL PROGRESS NOTE  Hospital Day(s): 1.   Post op day(s): 1 Day Post-Op.   Interval History:  Patient seen and examined No acute events or new complaints overnight.  Patient reports feeling well. Still having some abdominal discomfort. Denis any nausea or vomiting. Denies any bowel movements or flatulence.    Vital signs in last 24 hours: [min-max] current  Temp:  [97.7 F (36.5 C)-98.7 F (37.1 C)] 97.7 F (36.5 C) (07/15 0951) Pulse Rate:  [82-103] 89 (07/15 0951) Resp:  [8-20] 16 (07/15 0951) BP: (103-132)/(72-87) 119/81 (07/15 0951) SpO2:  [94 %-100 %] 96 % (07/15 0951)     Height: 5' 9 (175.3 cm) Weight: 65.8 kg BMI (Calculated): 21.4   Intake/Output last 2 shifts:  07/14 0701 - 07/15 0700 In: 1550 [I.V.:1300; IV Piggyback:250] Out: 2910 [Urine:2700; Emesis/NG output:50; Drains:160]   Physical Exam:  Constitutional: alert, cooperative and no distress  Respiratory: breathing non-labored at rest  Cardiovascular: regular rate and sinus rhythm  Gastrointestinal: soft, tender, and non-distended with prevena vacuum in place and jejunostomy feeding tube. JP drain had minimal serosanguinous output this morning.   Labs:     Latest Ref Rng & Units 02/07/2024    4:27 AM 02/06/2024    9:37 PM 02/06/2024    5:42 AM  CBC  WBC 4.0 - 10.5 K/uL 12.7  14.4  12.1   Hemoglobin 13.0 - 17.0 g/dL 86.5  86.8  83.7   Hematocrit 39.0 - 52.0 % 38.4  37.3  47.4   Platelets 150 - 400 K/uL 218  212  343       Latest Ref Rng & Units 02/07/2024    4:27 AM 02/06/2024    5:42 AM 12/20/2023    2:48 PM  CMP  Glucose 70 - 99 mg/dL 854  844  805   BUN 6 - 20 mg/dL 8  8  7    Creatinine 0.61 - 1.24 mg/dL 9.19  9.30  9.22   Sodium 135 - 145 mmol/L 144  139  133   Potassium 3.5 - 5.1 mmol/L 3.9  4.0  4.6   Chloride 98 - 111 mmol/L 111  104  97   CO2 22 - 32 mmol/L 25  21  26    Calcium 8.9 - 10.3 mg/dL 8.5  8.9  9.2   Total Protein 6.5 - 8.1 g/dL  6.3  6.9    Total Bilirubin 0.0 - 1.2 mg/dL  0.8  0.7   Alkaline Phos 38 - 126 U/L  96  79   AST 15 - 41 U/L  23  27   ALT 0 - 44 U/L  34  26     Imaging studies: No new pertinent imaging studies   Assessment/Plan:  48 y.o. male with perforation of intestine 1 Day Post-Op s/p exploratory laparotomy with small bowel resection and feeding jejunostomy creation , complicated by Roux-en-Y gastric bypass done several years ago and other pertinent comorbidities including alcohol use disorder, diabetes, and neuropathy.   - No fever, not tachycardiac with improving leukocytosis, 14.4 >> 12.7   - Continue NPO until bowel function returns, allow ice chips   - Provide feeding supplement via jejunostomy tube and follow dietician recommendations  - Creatinine levels stable, remove foley catheter    - Continue to monitor JP output and continue antibiotic therapy  - Continue IV fluids and pain management  - Continue DVT prophylaxis    -- Christian Nylander Barrientos PA-C

## 2024-02-07 NOTE — Discharge Instructions (Signed)
Intensive Outpatient Programs   [facility] [address] [place],  [place], [place] [phone]  [facility] (Inpatient and [phone] (Suboxone and Methadone) [address] [phone]  [facility]: Alcohol & Drug [facility] - Intensive Outpatient [address] [place], [place] [address], [place]  [phone]  [facility] (Outpatient, Inpatient, [facility] (Groups and Residental) (insurance only) [phone] [place], [place] [phone]   [facility] (for caregivers and family) [address] Laurell Josephs [address] [place], [place] [phone]  Residential Treatment Programs  [facility](2 years) Residential: 28 days)[facility] ([facility] [address] [place], [place], [place] [phone] or [phone]  [facility] Treatment [facility] [address] [place], [place], [place] [phone]  [facility] Residential Treatment FacilityResidential Treatment Services (RTS) [address] [place], [place], [place] [phone] Admissions: 8am-3pm M-F  [facility] Program: Residential Program [phone] Days)             [facility]: [facility]  [place], [place], [place]  [phone] or [phone] in Hours over the weekend or by referral)  [facility] [address] [place], [place] [address] [phone] (Do virtual or phone assessment, offer transportation within 25 miles, have in patient and Outpatient options)   [facility] Crisis: [facility]:[phone] (for crisis  response 24 hours a day)      Addiction and Co-Occurring Disorders   [facility], Intensive Outpatient Program is held three afternoons from 1 to 4pm; Monday, Wednesday and Friday allowing patients the opportunity to participate in a treatment program in the day while attending A meetings in the evening. W e provide educational lectures and films, group therapy, individual counseling and family counseling.  A multidisciplinary team of the [facility] healthcare professionals, headed by the Medical Director, designs an individualized treatment program to suit the needs of each patient. The length of stay in this program is determined by the goals and objectives set by the treatment team, but usually is 6 to 10 weeks. Direct admission to IOP is possible after an individual evaluation by our assessment team. Chemical usage patterns, the progressionof disease in the individual, the possible need for detoxification and the availability of an external support system are factors in determining who is appropriate for. direct OP admission. Each person has the opportunity to embrace a lifestyle of on-going recovery, family involvement and attending a 12 Step Support group. Program Objectives Include  Chemicaldependency education Abstinence for all mind-altering chemicals 12 Step support Relapse Prevention Planning Improved communication skills Spirituality and personal fulfilment Mondav-Wednesday-Friday 1:00pm to 4:00pm Individual and Family Sessions are set by the Counselor ANN EVANS, LCAS, NCC, MA Licensed Clinical Addictions Spcialist [phone]    [facility] Chemical Depencency Intensive OutpatientPrograms Alcohol & Drug Services (ADS) [address] [place] [address] [place]: [phone] [place]: [phone] : [phone] [place]: [phone] Both daytime & evening counseling available Accepts Medicaid and other insurances  [facility] [phone] Meets four times a week for 2 1?2 -3 hours each session Both daytime & evening counseling available Accepts Medicaid and other insurances  [facility] Health CD-IOP - Outpatient Services [address] [place] [address] [phone] Meets 3 days a week for 4 hours Accepts Medicare and  other insurances (not Medicaid)  [employer] [facility] Center [address] [place] [address] [phone]  These referrals have been provided to you as appropriate for your clinical needs while taking into account your financial concerns. Please be aware that agencies, practitioners and insurance companies sometimes change contracts. When calling to make an appointment have your insurance information available so the professional you aregoing to see can confirm whether they are covered by your plan. Take this form with you in case the person you are seeing needs a copy or to contact us. Assessment Counselor   [facility] Outpatient Programs [facility] Health Outpatient Chemical Dependence Intensive Outpatient Program [address] [place], [place] [address] [phone] Private insurance, Medicare A&B, and [facility] ADS (Alcohol and Drug Services) [address], [place], [place] [address] [phone] Medicaid, Self Pay  [facility] [address] [place]ipa [phone] Private Insurance, Self Pay  [facility] Partial Hospitalization Program [address] [place], [place] [address] [phone] Private Insurance, Medicaid-[place] and Loann Quill only for partial [facility] [phone] [place], [place] [phone] Private Insurance and Self Pay  [facility] 3 7 1  [facility] [address] [place], [place] [phone] Private Insurance, and Self Pay.  Theatre stage manager (Partial Hospitalization) [address] [place] [address] [phone] [facility] Outpatient [address] [employer] [address] [employer], [place], and Self Pay  [facility]one [facility] [address] [place], [place], [place] [address] [phone] [address] [address] [place], [place] [address] Phone: [phone] or [phone] [facility] Services [address] [place], [place] [address] [phone] Medicaid, private insurance [facility] [address] [place], [place] [address] [phone] or [phone] Private Insurance Only  [facility] [address] [phone] Self Pay only, sliding scale  [facility] [address] [place], [place] [address] [phone] State funds for uninsured [facility]. [place], [place] [phone] Private Insurance, and Self pay [employer], some Medicaid Crisis Mobile: [facility]: [phone] (for crisis response 24 hours a day) [facility] [phone]   [facility]cents [facility] Ng [phone] Private Insurance, and [facility]  [facility] [address] [place], [place] [phone] Private Insurance, and [place]. [facility]: [phone] ASAP: Adolescent Substance Abuse Program Males ages: 12-17 ASAP: Adolescent Substance abuse Program Females: 12-17 [employer] Structured Day Program [phone] CrisisMobile: [facility]: [phone] (for crisis response 24 hours a day) [facility] [phone]

## 2024-02-07 NOTE — Assessment & Plan Note (Signed)
No acute concern. -As needed bronchodilator

## 2024-02-07 NOTE — Hospital Course (Addendum)
 Taken for H&P.  Christian Mathis is a 48 y.o. male with medical history significant of IIDM, diabetic neuropathy, status post Roux-en-Y gastric bypass, presented with severe sudden onset of abdominal pain.  Patient woke up this morning about 2 AM with severe sharp like abdominal pain 10/10,  all over belly, constant.   On presentation hypotensive at 79/57 and was given 2 L of IV bolus.  Labs with leukocytosis at 12.1, hemoglobin 16, bicarb 21. CTA chest abdomen pelvis showed signs of bowel perforation and free air in peritoneal cavity.  General surgery was consulted and patient was taken to the OR emergently s/p exploratory laparotomy with small bowel resection and feeding jejunostomy creation.  7/15: Hemodynamically stable.  Labs with improving leukocytosis at 12.7, hemoglobin 13.4.  UA was negative.  UDS was done this morning and positive for opioids and benzodiazepines which he got in the ED. A1c of 7.2, preliminary blood cultures negative in 12 hours.  Pending surgical pathology. Surgery is planning to start trickle feed through jejunostomy feeding tube.  7/16: Hemodynamically stable, started on trickle feed, general surgery is going to do a contrast study to rule out any anastomosis leak before removing NG tube. Wound VAC and drain in place.  Nutritional labs with folate of 6.5 and rest pending.  No BM yet.  7/17: Vitals with mildly elevated blood pressure, worsening abdominal pain, no flatus or BM.  Having some nausea and vomiting, NG tube was removed earlier.  Upper GI studies and CT abdomen from yesterday confirm no anastomosis leak.  Concern of developing postoperative ileus-tube feed was held and NG tube replacement was ordered.  7/18: Significant bilious secretions with NG tube, surgery ordered TPN and patient will remain n.p.o. as still no bowel function.  Mild hypokalemia and borderline magnesium which are being repleted

## 2024-02-07 NOTE — Assessment & Plan Note (Signed)
 No acute concern. - Will resume home gabapentin  once able to take p.o. or by jejunostomy feeding tube

## 2024-02-07 NOTE — Progress Notes (Signed)
 The patient ambulated 4 laps around the unit with SBA. He tolerated this well and without difficulty. He was also given an incentive breathing spirometer and instructed on how to use it. He demonstrated proper usage and tolerated it well.

## 2024-02-07 NOTE — Assessment & Plan Note (Signed)
 Patient met severe sepsis criteria with tachycardia, leukocytosis and lactic acidosis, secondary to acute peritonitis from bowel rupture. S/p exploratory laparotomy and small bowel resection with jejunostomy feeding feeding tube placement.  Preliminary blood cultures negative General surgery is on board and managing Going to start trickle feed with jejunostomy feeding tube today. Labs seem stable with some improving leukocytosis and hemoglobin of 13.4. - Continue with Zosyn  -Follow recommendations by general surgery

## 2024-02-07 NOTE — Plan of Care (Signed)
 The patient spent a large portion of the afternoon in the bedside chair without difficulty. His trickle tube feeds were initiated this morning and he denies any increased pain, nausea, or vomiting. His foley was discontinued and he is voiding without difficulty. He continues to have pain and is requiring frequent PRN IV narcotics with adequate relief. VSS. Fall precautions in place. Will continue to monitor.  Problem: Education: Goal: Ability to describe self-care measures that may prevent or decrease complications (Diabetes Survival Skills Education) will improve Outcome: Progressing Goal: Individualized Educational Video(s) Outcome: Progressing   Problem: Coping: Goal: Ability to adjust to condition or change in health will improve Outcome: Progressing   Problem: Fluid Volume: Goal: Ability to maintain a balanced intake and output will improve Outcome: Progressing   Problem: Health Behavior/Discharge Planning: Goal: Ability to identify and utilize available resources and services will improve Outcome: Progressing Goal: Ability to manage health-related needs will improve Outcome: Progressing   Problem: Metabolic: Goal: Ability to maintain appropriate glucose levels will improve Outcome: Progressing   Problem: Nutritional: Goal: Maintenance of adequate nutrition will improve Outcome: Progressing Goal: Progress toward achieving an optimal weight will improve Outcome: Progressing   Problem: Skin Integrity: Goal: Risk for impaired skin integrity will decrease Outcome: Progressing   Problem: Tissue Perfusion: Goal: Adequacy of tissue perfusion will improve Outcome: Progressing   Problem: Education: Goal: Knowledge of General Education information will improve Description: Including pain rating scale, medication(s)/side effects and non-pharmacologic comfort measures Outcome: Progressing   Problem: Health Behavior/Discharge Planning: Goal: Ability to manage health-related  needs will improve Outcome: Progressing   Problem: Clinical Measurements: Goal: Ability to maintain clinical measurements within normal limits will improve Outcome: Progressing Goal: Will remain free from infection Outcome: Progressing Goal: Diagnostic test results will improve Outcome: Progressing Goal: Respiratory complications will improve Outcome: Progressing Goal: Cardiovascular complication will be avoided Outcome: Progressing   Problem: Activity: Goal: Risk for activity intolerance will decrease Outcome: Progressing   Problem: Nutrition: Goal: Adequate nutrition will be maintained Outcome: Progressing   Problem: Coping: Goal: Level of anxiety will decrease Outcome: Progressing   Problem: Elimination: Goal: Will not experience complications related to bowel motility Outcome: Progressing Goal: Will not experience complications related to urinary retention Outcome: Progressing   Problem: Pain Managment: Goal: General experience of comfort will improve and/or be controlled Outcome: Progressing   Problem: Safety: Goal: Ability to remain free from injury will improve Outcome: Progressing   Problem: Skin Integrity: Goal: Risk for impaired skin integrity will decrease Outcome: Progressing

## 2024-02-07 NOTE — Progress Notes (Signed)
 Progress Note   Patient: Christian Mathis FMW:[id number] DOB: 1976-05-30 DOA: 02/06/2024     1 DOS: the patient was seen and examined on 02/07/2024   Brief hospital course: Taken for H&P.  AKHILESH SASSONE is a 48 y.o. male with medical history significant of IIDM, diabetic neuropathy, status post Roux-en-Y gastric bypass, presented with severe sudden onset of abdominal pain.  Patient woke up this morning about 2 AM with severe sharp like abdominal pain 10/10,  all over belly, constant.   On presentation hypotensive at 79/57 and was given 2 L of IV bolus.  Labs with leukocytosis at 12.1, hemoglobin 16, bicarb 21. CTA chest abdomen pelvis showed signs of bowel perforation and free air in peritoneal cavity.  General surgery was consulted and patient was taken to the OR emergently s/p exploratory laparotomy with small bowel resection and feeding jejunostomy creation.  7/15: Hemodynamically stable.  Labs with improving leukocytosis at 12.7, hemoglobin 13.4.  UA was negative.  UDS was done this morning and positive for opioids and benzodiazepines which he got in the ED. A1c of 7.2, preliminary blood cultures negative in 12 hours.  Pending surgical pathology. Surgery is planning to start trickle feed through jejunostomy feeding tube.  Assessment and Plan: * Bowel perforation [facility]) Patient met severe sepsis criteria with tachycardia, leukocytosis and lactic acidosis, secondary to acute peritonitis from bowel rupture. S/p exploratory laparotomy and small bowel resection with jejunostomy feeding feeding tube placement.  Preliminary blood cultures negative General surgery is on board and managing Going to start trickle feed with jejunostomy feeding tube today. Labs seem stable with some improving leukocytosis and hemoglobin of 13.4. - Continue with Zosyn  -Follow recommendations by general surgery  Diabetes mellitus without complication (HCC) CBG currently within goal, A1c of 7.2. Keep holding home  metformin -Continue with SSI  Neuropathy No acute concern. - Will resume home gabapentin  once able to take p.o. or by jejunostomy feeding tube  Alcohol abuse - Continue with CIWA protocol  COPD (chronic obstructive pulmonary disease) (HCC) No acute concern. - As needed bronchodilator   Subjective: Patient is having some abdominal discomfort but stating it is much better than yesterday.  No nausea or vomiting. Biliary secretions and NG and jejunostomy feeding tube. No bowel movement yet.  Physical Exam: Vitals:   02/06/24 1430 02/06/24 1954 02/07/24 0130 02/07/24 0951  BP: 115/76 107/72 103/72 119/81  Pulse: 90 88 82 89  Resp:  18 18 16   Temp: 98.2 F (36.8 C) 98.7 F (37.1 C) 98.3 F (36.8 C) 97.7 F (36.5 C)  TempSrc: Oral   Oral  SpO2: 95% 94% 96% 96%  Weight:      Height:       General.  Well-developed gentleman, in no acute distress.  NG tube in place Pulmonary.  Lungs clear bilaterally, normal respiratory effort. CV.  Regular rate and rhythm, no JVD, rub or murmur. Abdomen.  Soft, mild diffuse tenderness, midline dressing with a drain and wound VAC, jejunostomy feeding tube in place CNS.  Alert and oriented .  No focal neurologic deficit. Extremities.  No edema, no cyanosis, pulses intact and symmetrical. Psychiatry.  Judgment and insight appears normal.   Data Reviewed: Prior data reviewed  Family Communication: Discussed with patient  Disposition: Status is: Inpatient Remains inpatient appropriate because: Severity of illness  Planned Discharge Destination: Home  DVT prophylaxis.  Lovenox  Time spent: 50 minutes  This record has been created using Conservation officer, historic buildings. Errors have been sought and corrected,but may  not always be located. Such creation errors do not reflect on the standard of care.   Author: Amaryllis Dare, MD 02/07/2024 10:33 AM  For on call review [email or web address].

## 2024-02-08 ENCOUNTER — Inpatient Hospital Stay

## 2024-02-08 DIAGNOSIS — K659 Peritonitis, unspecified: Secondary | ICD-10-CM | POA: Diagnosis not present

## 2024-02-08 DIAGNOSIS — E43 Unspecified severe protein-calorie malnutrition: Secondary | ICD-10-CM | POA: Insufficient documentation

## 2024-02-08 DIAGNOSIS — K631 Perforation of intestine (nontraumatic): Secondary | ICD-10-CM | POA: Diagnosis not present

## 2024-02-08 DIAGNOSIS — Z9884 Bariatric surgery status: Secondary | ICD-10-CM | POA: Diagnosis not present

## 2024-02-08 DIAGNOSIS — E119 Type 2 diabetes mellitus without complications: Secondary | ICD-10-CM | POA: Diagnosis not present

## 2024-02-08 LAB — PHOSPHORUS: Phosphorus: 3.3 mg/dL (ref 2.5–4.6)

## 2024-02-08 LAB — BASIC METABOLIC PANEL WITH GFR
Anion gap: 12 (ref 5–15)
BUN: 9 mg/dL (ref 6–20)
CO2: 25 mmol/L (ref 22–32)
Calcium: 8.9 mg/dL (ref 8.9–10.3)
Chloride: 103 mmol/L (ref 98–111)
Creatinine, Ser: 0.62 mg/dL (ref 0.61–1.24)
GFR, Estimated: >60 mL/min (ref >60)
Glucose, Bld: 136 mg/dL — ABNORMAL HIGH (ref 70–99)
Potassium: 3.6 mmol/L (ref 3.5–5.1)
Sodium: 140 mmol/L (ref 135–145)

## 2024-02-08 LAB — MAGNESIUM: Magnesium: 2.1 mg/dL (ref 1.7–2.4)

## 2024-02-08 LAB — GLUCOSE, CAPILLARY
Glucose-Capillary: 142 mg/dL — ABNORMAL HIGH (ref 70–99)
Glucose-Capillary: 91 mg/dL (ref 70–99)
Glucose-Capillary: 120 mg/dL — ABNORMAL HIGH (ref 70–99)
Glucose-Capillary: 265 mg/dL — ABNORMAL HIGH (ref 70–99)

## 2024-02-08 LAB — LACTIC ACID, PLASMA: Lactic Acid, Venous: 0.9 mmol/L (ref 0.5–1.9)

## 2024-02-08 LAB — VITAMIN D 25 HYDROXY (VIT D DEFICIENCY, FRACTURES): Vit D, 25-Hydroxy: 10.83 ng/mL — ABNORMAL LOW (ref 30–100)

## 2024-02-08 LAB — VITAMIN B12: Vitamin B-12: 400 pg/mL (ref 180–914)

## 2024-02-08 LAB — SURGICAL PATHOLOGY

## 2024-02-08 LAB — FOLATE: Folate: 6.5 ng/mL (ref >5.9)

## 2024-02-08 MED ORDER — FOLIC ACID 5 MG/ML IJ SOLN
1.0000 mg | Freq: Every day | INTRAMUSCULAR | Status: DC
  Administered 2024-02-08 – 2024-02-15 (×8): 1 mg via INTRAVENOUS
  Filled 2024-02-08 (×9): qty 0.2

## 2024-02-08 MED ORDER — IOHEXOL 300 MG/ML  SOLN
75.0000 mL | Freq: Once | INTRAMUSCULAR | Status: AC | PRN
  Administered 2024-02-08: 75 mL

## 2024-02-08 MED ORDER — IOHEXOL 300 MG/ML  SOLN
100.0000 mL | Freq: Once | INTRAMUSCULAR | Status: AC | PRN
  Administered 2024-02-08: 100 mL via INTRAVENOUS

## 2024-02-08 NOTE — Progress Notes (Signed)
 Progress Note   Patient: Christian Mathis FMW:[id number] DOB: 02-May-1976 DOA: 02/06/2024     2 DOS: the patient was seen and examined on 02/08/2024   Brief hospital course: Taken for H&P.  Christian LIPSETT is a 48 y.o. male with medical history significant of IIDM, diabetic neuropathy, status post Roux-en-Y gastric bypass, presented with severe sudden onset of abdominal pain.  Patient woke up this morning about 2 AM with severe sharp like abdominal pain 10/10,  all over belly, constant.   On presentation hypotensive at 79/57 and was given 2 L of IV bolus.  Labs with leukocytosis at 12.1, hemoglobin 16, bicarb 21. CTA chest abdomen pelvis showed signs of bowel perforation and free air in peritoneal cavity.  General surgery was consulted and patient was taken to the OR emergently s/p exploratory laparotomy with small bowel resection and feeding jejunostomy creation.  7/15: Hemodynamically stable.  Labs with improving leukocytosis at 12.7, hemoglobin 13.4.  UA was negative.  UDS was done this morning and positive for opioids and benzodiazepines which he got in the ED. A1c of 7.2, preliminary blood cultures negative in 12 hours.  Pending surgical pathology. Surgery is planning to start trickle feed through jejunostomy feeding tube.  7/16: Hemodynamically stable, started on trickle feed, general surgery is going to do a contrast study to rule out any anastomosis leak before removing NG tube. Wound VAC and drain in place.  Nutritional labs with folate of 6.5 and rest pending.  No BM yet.  Assessment and Plan: * Bowel perforation [facility]) Patient met severe sepsis criteria with tachycardia, leukocytosis and lactic acidosis, secondary to acute peritonitis from bowel rupture. S/p exploratory laparotomy and small bowel resection with jejunostomy feeding feeding tube placement.  Preliminary blood cultures negative General surgery is on board and managing General Surgery ordered if water  contrast to rule out  any anastomosis leak. Started on trickle feeds through jejunostomy tube Labs seem stable  - Continue with Zosyn  -Follow recommendations by general surgery  Diabetes mellitus without complication (HCC) CBG currently within goal, A1c of 7.2. Keep holding home metformin -Continue with SSI  Neuropathy No acute concern. - Will resume home gabapentin  once able to take p.o. or by jejunostomy feeding tube  Alcohol abuse - Continue with CIWA protocol  COPD (chronic obstructive pulmonary disease) (HCC) No acute concern. - As needed bronchodilator  Subjective: Patient was seen and examined today.  Still having some abdominal discomfort, no flatus or bowel movement today.  Seems like tolerating jejunostomy tube feed.  Physical Exam: Vitals:   02/07/24 1953 02/08/24 0500 02/08/24 0509 02/08/24 0510  BP: (!) 156/81  (!) 148/100 (!) 147/93  Pulse: 78  89 83  Resp: 16  16   Temp: 98.8 F (37.1 C)  98.2 F (36.8 C)   TempSrc:      SpO2: 100%  100% 98%  Weight:  61.4 kg    Height:       General.  Malnourished gentleman, in no acute distress. Pulmonary.  Lungs clear bilaterally, normal respiratory effort. CV.  Regular rate and rhythm, no JVD, rub or murmur. Abdomen.  Soft, mild diffuse tenderness, central laparotomy with wound VAC, drain with serosanguineous discharge and jejunostomy tube in place CNS.  Alert and oriented .  No focal neurologic deficit. Extremities.  No edema, no cyanosis, pulses intact and symmetrical. Psychiatry.  Judgment and insight appears normal.   Data Reviewed: Prior data reviewed  Family Communication: Discussed with patient  Disposition: Status is: Inpatient Remains inpatient appropriate  because: Severity of illness  Planned Discharge Destination: Home  DVT prophylaxis.  Lovenox  Time spent: 50 minutes  This record has been created using Conservation officer, historic buildings. Errors have been sought and corrected,but may not always be located. Such  creation errors do not reflect on the standard of care.   Author: Amaryllis Dare, MD 02/08/2024 1:29 PM  For on call review [email or web address].

## 2024-02-08 NOTE — Progress Notes (Signed)
 [facility]- General Surgery  SURGICAL PROGRESS NOTE  Hospital Day(s): 2.   Post op day(s): 2 Days Post-Op.   Interval History:  Patient reports feeling well. Still sore but better controlled. Drain output is still serosanguineous. NGT still in place, just having ice chips. Patient is getting feeding supplement via jejunotomy tube. Denies nausea or vomiting.   Vital signs in last 24 hours: [min-max] current  Temp:  [98.2 F (36.8 C)-98.8 F (37.1 C)] 98.2 F (36.8 C) (07/16 0509) Pulse Rate:  [78-89] 83 (07/16 0510) Resp:  [16] 16 (07/16 0509) BP: (147-156)/(81-100) 147/93 (07/16 0510) SpO2:  [98 %-100 %] 98 % (07/16 0510) Weight:  [60.8 kg-61.4 kg] 61.4 kg (07/16 0500)     Height: 5' 9 (175.3 cm) Weight: 61.4 kg BMI (Calculated): 19.98   Intake/Output last 2 shifts:  07/15 0701 - 07/16 0700 In: 2040.5 [I.V.:1650.9; NG/GT:268.3; IV Piggyback:121.3] Out: 6574 [Lmpwz:8534; Emesis/NG output:1800; Drains:160]   Physical Exam:  Constitutional: alert, cooperative and no distress  Respiratory: breathing non-labored at rest  Cardiovascular: regular rate and sinus rhythm  Gastrointestinal: soft, mildly tender, and non-distended, with prevena vacuum in place and jejunostomy feeding tube. JP drain had serosanguinous output   Labs:     Latest Ref Rng & Units 02/07/2024    4:27 AM 02/06/2024    9:37 PM 02/06/2024    5:42 AM  CBC  WBC 4.0 - 10.5 K/uL 12.7  14.4  12.1   Hemoglobin 13.0 - 17.0 g/dL 86.5  86.8  83.7   Hematocrit 39.0 - 52.0 % 38.4  37.3  47.4   Platelets 150 - 400 K/uL 218  212  343       Latest Ref Rng & Units 02/08/2024    5:30 AM 02/07/2024    4:27 AM 02/06/2024    5:42 AM  CMP  Glucose 70 - 99 mg/dL 863  854  844   BUN 6 - 20 mg/dL 9  8  8    Creatinine 0.61 - 1.24 mg/dL 9.37  9.19  9.30   Sodium 135 - 145 mmol/L 140  144  139   Potassium 3.5 - 5.1 mmol/L 3.6  3.9  4.0   Chloride 98 - 111 mmol/L 103  111  104   CO2 22 - 32 mmol/L 25  25  21    Calcium 8.9 -  10.3 mg/dL 8.9  8.5  8.9   Total Protein 6.5 - 8.1 g/dL   6.3   Total Bilirubin 0.0 - 1.2 mg/dL   0.8   Alkaline Phos 38 - 126 U/L   96   AST 15 - 41 U/L   23   ALT 0 - 44 U/L   34     Imaging studies: No new pertinent imaging studies   Assessment/Plan:  48 y.o. male with perforation of intestine  2 Days Post-Op s/p exploratory laparotomy with small bowel resection and feeding jejunostomy creation, complicated by pertinent comorbidities including  alcohol use disorder, diabetes, and neuropathy.    - Vital signs stable    - Ordered upper GI study with water -soluble contrast and CT of abdomen with IV contrast as well as water  soluble contrast, following upper GI to check for anastomosis leak or other complications. If no complications are shown on imaging, may consider clamping NGT.  - Continue NPO until patient experiences flatulence/bowel movement    - Continue to monitor JP output - Continue feeding supplement via jejunostomy tube per dietician recommendations  - Continue IV fluids  -  Continue pain management and DVT prophylaxis   -- Eddye Broxterman Barrientos PA-C

## 2024-02-08 NOTE — Assessment & Plan Note (Signed)
 Patient met severe sepsis criteria with tachycardia, leukocytosis and lactic acidosis, secondary to acute peritonitis from bowel rupture. S/p exploratory laparotomy and small bowel resection with jejunostomy feeding feeding tube placement.  Preliminary blood cultures negative General surgery is on board and managing Upper GI series and CT done yesterday was negative for any anastomosis leak Labs seem stable  - Continue with Zosyn  -Follow recommendations by general surgery

## 2024-02-09 ENCOUNTER — Other Ambulatory Visit: Payer: Self-pay

## 2024-02-09 ENCOUNTER — Inpatient Hospital Stay

## 2024-02-09 DIAGNOSIS — E119 Type 2 diabetes mellitus without complications: Secondary | ICD-10-CM | POA: Diagnosis not present

## 2024-02-09 DIAGNOSIS — Z9884 Bariatric surgery status: Secondary | ICD-10-CM | POA: Diagnosis not present

## 2024-02-09 DIAGNOSIS — K567 Ileus, unspecified: Secondary | ICD-10-CM

## 2024-02-09 DIAGNOSIS — K631 Perforation of intestine (nontraumatic): Secondary | ICD-10-CM | POA: Diagnosis not present

## 2024-02-09 DIAGNOSIS — K659 Peritonitis, unspecified: Secondary | ICD-10-CM | POA: Diagnosis not present

## 2024-02-09 LAB — BASIC METABOLIC PANEL WITH GFR
Anion gap: 6 (ref 5–15)
BUN: 6 mg/dL (ref 6–20)
CO2: 28 mmol/L (ref 22–32)
Calcium: 8.5 mg/dL — ABNORMAL LOW (ref 8.9–10.3)
Chloride: 98 mmol/L (ref 98–111)
Creatinine, Ser: 0.47 mg/dL — ABNORMAL LOW (ref 0.61–1.24)
GFR, Estimated: >60 mL/min (ref >60)
Glucose, Bld: 178 mg/dL — ABNORMAL HIGH (ref 70–99)
Potassium: 3.5 mmol/L (ref 3.5–5.1)
Sodium: 132 mmol/L — ABNORMAL LOW (ref 135–145)

## 2024-02-09 LAB — GLUCOSE, CAPILLARY
Glucose-Capillary: 172 mg/dL — ABNORMAL HIGH (ref 70–99)
Glucose-Capillary: 164 mg/dL — ABNORMAL HIGH (ref 70–99)
Glucose-Capillary: 143 mg/dL — ABNORMAL HIGH (ref 70–99)
Glucose-Capillary: 174 mg/dL — ABNORMAL HIGH (ref 70–99)

## 2024-02-09 LAB — PHOSPHORUS: Phosphorus: 2.9 mg/dL (ref 2.5–4.6)

## 2024-02-09 LAB — MAGNESIUM: Magnesium: 1.8 mg/dL (ref 1.7–2.4)

## 2024-02-09 LAB — COPPER, SERUM: Copper: 79 ug/dL (ref 69–132)

## 2024-02-09 LAB — ZINC: Zinc: 35 ug/dL — ABNORMAL LOW (ref 44–115)

## 2024-02-09 MED ORDER — KCL IN DEXTROSE-NACL 40-5-0.9 MEQ/L-%-% IV SOLN
INTRAVENOUS | Status: DC
  Filled 2024-02-09: qty 1000

## 2024-02-09 MED ORDER — KCL IN DEXTROSE-NACL 20-5-0.9 MEQ/L-%-% IV SOLN
INTRAVENOUS | Status: AC
  Filled 2024-02-09 (×2): qty 1000

## 2024-02-09 MED ORDER — IOHEXOL 300 MG/ML  SOLN
100.0000 mL | Freq: Once | INTRAMUSCULAR | Status: AC | PRN
  Administered 2024-02-09: 100 mL via INTRAVENOUS

## 2024-02-09 MED ORDER — IOHEXOL 300 MG/ML  SOLN
50.0000 mL | Freq: Once | INTRAMUSCULAR | Status: AC | PRN
  Administered 2024-02-09: 100 mL via ORAL
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
## 2024-02-09 NOTE — Assessment & Plan Note (Signed)
 Patient developed worsening abdominal pain with vomiting.  Tube feed was held and NG tube was replaced.  Repeat imaging with concern of increased fluid fill dilation of small bowel likely ileus.  No extraluminal contrast leak. -Patient to remain n.p.o. -Continue with NG tube suctioning -TPN will be started today-PICC line was placed -Supportive care

## 2024-02-09 NOTE — Progress Notes (Addendum)
 [facility]- General Surgery  SURGICAL PROGRESS NOTE  Hospital Day(s): 3.   Post op day(s): 3 Days Post-Op.   Interval History:  This morning patient was doing well complaining of minimal pain. Had been tolerating clear liquid diet. Upper GI study and CT of abdomen from yesterday confirmed no anastomosis leakage. Around noon today, patient started experiencing worsening abdominal pain, mostly on LUQ associated with multiple bilious emesis episodes. States symptoms were sudden after waking up from a nap.   Vital signs in last 24 hours: [min-max] current  Temp:  [98.2 F (36.8 C)-98.7 F (37.1 C)] 98.4 F (36.9 C) (07/17 0723) Pulse Rate:  [65-76] 74 (07/17 0723) Resp:  [16-18] 18 (07/17 0723) BP: (146-158)/(91-97) 146/97 (07/17 0723) SpO2:  [97 %-100 %] 100 % (07/17 0723) Weight:  [62.9 kg] 62.9 kg (07/17 0449)     Height: 5' 9 (175.3 cm) Weight: 62.9 kg BMI (Calculated): 20.47   Intake/Output last 2 shifts:  07/16 0701 - 07/17 0700 In: 540 [P.O.:360; NG/GT:180] Out: 2800 [Urine:1350; Emesis/NG output:1350; Drains:100]   Physical Exam:  Constitutional: alert, cooperative and no distress  Respiratory: breathing non-labored at rest  Cardiovascular: regular rate and sinus rhythm  Gastrointestinal: soft, tender on LUQ, and mildly distended with prevena vacuum intact and JP drain in place with serosanguineous output   Labs:     Latest Ref Rng & Units 02/07/2024    4:27 AM 02/06/2024    9:37 PM 02/06/2024    5:42 AM  CBC  WBC 4.0 - 10.5 K/uL 12.7  14.4  12.1   Hemoglobin 13.0 - 17.0 g/dL 86.5  86.8  83.7   Hematocrit 39.0 - 52.0 % 38.4  37.3  47.4   Platelets 150 - 400 K/uL 218  212  343       Latest Ref Rng & Units 02/09/2024    5:23 AM 02/08/2024    5:30 AM 02/07/2024    4:27 AM  CMP  Glucose 70 - 99 mg/dL 821  863  854   BUN 6 - 20 mg/dL 6  9  8    Creatinine 0.61 - 1.24 mg/dL 9.52  9.37  9.19   Sodium 135 - 145 mmol/L 132  140  144   Potassium 3.5 - 5.1 mmol/L 3.5   3.6  3.9   Chloride 98 - 111 mmol/L 98  103  111   CO2 22 - 32 mmol/L 28  25  25    Calcium 8.9 - 10.3 mg/dL 8.5  8.9  8.5     Imaging studies: No new pertinent imaging studies   Assessment/Plan:  48 y.o. male with perforation of intestine  3 Days Post-Op s/p, exploratory laparotomy with small bowel resection and feeding jejunostomy creation  complicated by pertinent comorbidities including alcohol use disorder, diabetes, and neuropathy. \  - Patient is likely experiencing a post-op ileus. Will manage conservatively with NGT, NPO and IV fluids.  - Discontinue feeding supplement via J-tube to allow bowel rest   - Continue to monitor JP output  - Continue pain management and DVT prophylaxis    -- Taiwo Fish Barrientos PA-C

## 2024-02-09 NOTE — Progress Notes (Signed)
 Nutrition Follow Up Note   DOCUMENTATION CODES:   Severe malnutrition in context of chronic illness  INTERVENTION:   TPN per pharmacy- to start 7/18  Recommend zinc  5mg  daily added in TPN  Pt remains at high refeed risk; recommend monitor potassium, magnesium and phosphorus labs daily until stable  Continue MVI, folic acid  and thiamine  daily in TPN  If tube feeds initiated, recommend:  Vital 1.5@65ml /hr- Initiate at 1ml/hr, once ok per surgery, increase by 29ml/hr q8 hours until goal rate is reached.   Free water  flushes 30ml q4 hours to maintain tube patency   Regimen provides 2340kcal/day, 105g/day protein and 1331ml/day of free water .   Vitamins B1, A & E labs pending.    NUTRITION DIAGNOSIS:   Severe Malnutrition related to chronic illness (roux-en-y, COPD, etoh abuse) as evidenced by severe fat depletion, severe muscle depletion. -ongoing   GOAL:   Patient will meet greater than or equal to 90% of their needs -not met   MONITOR:   Diet advancement, Labs, Weight trends, Skin, I & O's, Other (Comment) (TPN)  ASSESSMENT:   48 y/o male with h/o DM, etoh abuse, GERD, MDD, morbid obesity s/p rouex-en-y gastric bypass (2014), COPD, OSA, neuropathy and chronic pancreatitis who is admitted with small bowel perforation now s/p exploratory laparotomy with small bowel resection & feeding jejunostomy creation 7/14.  Pt noted to have developing ileus. Tube feeds held. NGT in place to LIS with output. Plan is for PICC line and TPN tomorrow. Pt remains at high refeed risk. Per chart, pt is up ~4lbs since admission. Vitamin labs pending; RD will provide supplementation as needed.    Medications reviewed and include: lovenox , folic acid , insulin , nicotine , protonix , thiamine , zosyn , 5% dextrose  w/ KCl @75ml /hr  Labs reviewed: Na 132(L), K 3.5 wnl, creat 0.47(L), P 2.9 wnl, Mg 1.8 wnl Folate 6.5 wnl, copper  79 wnl, vitamin D  10.85(L), B12 400 wnl, zinc  35(L)- 7/16 Wbc-  12.7(H)- 7/15 Cbgs- 143, 164, 172 x 24 hrs   Drains-   UOP-   Diet Order:    Diet Order             Diet NPO time specified  Diet effective now                  EDUCATION NEEDS:   Education needs have been addressed  Skin:  Skin Assessment: Reviewed RN Assessment (incision abdomen)  Last BM:  7/14  Height:   Ht Readings from Last 1 Encounters:  02/06/24 5' 9 (1.753 m)    Weight:   Wt Readings from Last 1 Encounters:  02/09/24 62.9 kg    Ideal Body Weight:  72.7 kg  BMI:  Body mass index is 20.48 kg/m.  Estimated Nutritional Needs:   Kcal:  2000-2300kcal/day  Protein:  100-115g/day  Fluid:  2.0-2.3L/day  Augustin Shams MS, RD, LDN If unable to be reached, please send secure chat to RD inpatient available from 8:00a-4:00p daily

## 2024-02-09 NOTE — Progress Notes (Signed)
 Progress Note   Patient: Christian Mathis FMW:[id number] DOB: [birth year] DOA: 02/06/2024     3 DOS: the patient was seen and examined on 02/09/2024   Brief hospital course: Taken for H&P.  ANTHONIO MIZZELL is a 48 y.o. male with medical history significant of IIDM, diabetic neuropathy, status post Roux-en-Y gastric bypass, presented with severe sudden onset of abdominal pain.  Patient woke up this morning about 2 AM with severe sharp like abdominal pain 10/10,  all over belly, constant.   On presentation hypotensive at 79/57 and was given 2 L of IV bolus.  Labs with leukocytosis at 12.1, hemoglobin 16, bicarb 21. CTA chest abdomen pelvis showed signs of bowel perforation and free air in peritoneal cavity.  General surgery was consulted and patient was taken to the OR emergently s/p exploratory laparotomy with small bowel resection and feeding jejunostomy creation.  7/15: Hemodynamically stable.  Labs with improving leukocytosis at 12.7, hemoglobin 13.4.  UA was negative.  UDS was done this morning and positive for opioids and benzodiazepines which he got in the ED. A1c of 7.2, preliminary blood cultures negative in 12 hours.  Pending surgical pathology. Surgery is planning to start trickle feed through jejunostomy feeding tube.  7/16: Hemodynamically stable, started on trickle feed, general surgery is going to do a contrast study to rule out any anastomosis leak before removing NG tube. Wound VAC and drain in place.  Nutritional labs with folate of 6.5 and rest pending.  No BM yet.  7/17: Vitals with mildly elevated blood pressure, worsening abdominal pain, no flatus or BM.  Having some nausea and vomiting, NG tube was removed earlier.  Upper GI studies and CT abdomen from yesterday confirm no anastomosis leak.  Concern of developing postoperative ileus-tube feed was held and NG tube replacement was ordered.  Assessment and Plan: * Bowel perforation [facility]) Patient met severe sepsis criteria with  tachycardia, leukocytosis and lactic acidosis, secondary to acute peritonitis from bowel rupture. S/p exploratory laparotomy and small bowel resection with jejunostomy feeding feeding tube placement.  Preliminary blood cultures negative General surgery is on board and managing Upper GI series and CT done yesterday was negative for any anastomosis leak Labs seem stable  - Continue with Zosyn  -Follow recommendations by general surgery  Postoperative ileus (HCC) Patient developed worsening abdominal pain with vomiting.  Tube feed was held and NG tube replacement was ordered. No flatus or BM-concerning for developing postoperative ileus -Surgery ordered another imaging -Patient to remain n.p.o. -Supportive care  Diabetes mellitus without complication (HCC) CBG currently within goal, A1c of 7.2. Keep holding home metformin -Continue with SSI  Neuropathy No acute concern. - Will resume home gabapentin  once able to take p.o. or by jejunostomy feeding tube  Alcohol abuse - Continue with CIWA protocol  COPD (chronic obstructive pulmonary disease) (HCC) No acute concern. - As needed bronchodilator  Subjective: Patient was having significant abdominal pain with nausea and vomiting.  Has not passing any flatus, no bowel movement.  Physical Exam: Vitals:   02/09/24 0723 02/09/24 1500 02/09/24 1549 02/09/24 1615  BP: (!) 146/97  (!) 140/110 (!) 140/102  Pulse: 74  (!) 116 (!) 104  Resp: 18  16   Temp: 98.4 F (36.9 C) 98.4 F (36.9 C)    TempSrc: Oral     SpO2: 100%  98% 98%  Weight:      Height:       General.  Malnourished, ill-appearing gentleman, in no acute distress. Pulmonary.  Lungs clear  bilaterally, normal respiratory effort. CV.  Regular rate and rhythm, no JVD, rub or murmur. Abdomen.  Soft, mild diffuse tenderness, mildly distended, hypoactive bowel sounds.  Midline incision with wound VAC, drain and jejunostomy tube in place CNS.  Alert and oriented .  No focal  neurologic deficit. Extremities.  No edema, pulses intact and symmetrical. Psychiatry.  Judgment and insight appears normal.   Data Reviewed: Prior data reviewed  Family Communication: Discussed with patient  Disposition: Status is: Inpatient Remains inpatient appropriate because: Severity of illness  Planned Discharge Destination: Home  DVT prophylaxis.  Lovenox  Time spent: 50 minutes  This record has been created using Conservation officer, historic buildings. Errors have been sought and corrected,but may not always be located. Such creation errors do not reflect on the standard of care.   Author: Amaryllis Dare, MD 02/09/2024 5:08 PM  For on call review [email or web address].

## 2024-02-10 DIAGNOSIS — E119 Type 2 diabetes mellitus without complications: Secondary | ICD-10-CM | POA: Diagnosis not present

## 2024-02-10 DIAGNOSIS — K659 Peritonitis, unspecified: Secondary | ICD-10-CM | POA: Diagnosis not present

## 2024-02-10 DIAGNOSIS — K631 Perforation of intestine (nontraumatic): Secondary | ICD-10-CM | POA: Diagnosis not present

## 2024-02-10 DIAGNOSIS — Z9884 Bariatric surgery status: Secondary | ICD-10-CM | POA: Diagnosis not present

## 2024-02-10 LAB — BASIC METABOLIC PANEL WITH GFR
Anion gap: 7 (ref 5–15)
BUN: 6 mg/dL (ref 6–20)
CO2: 30 mmol/L (ref 22–32)
Calcium: 8.3 mg/dL — ABNORMAL LOW (ref 8.9–10.3)
Chloride: 98 mmol/L (ref 98–111)
Creatinine, Ser: 0.64 mg/dL (ref 0.61–1.24)
GFR, Estimated: >60 mL/min (ref >60)
Glucose, Bld: 164 mg/dL — ABNORMAL HIGH (ref 70–99)
Potassium: 3.2 mmol/L — ABNORMAL LOW (ref 3.5–5.1)
Sodium: 135 mmol/L (ref 135–145)

## 2024-02-10 LAB — MAGNESIUM: Magnesium: 1.7 mg/dL (ref 1.7–2.4)

## 2024-02-10 LAB — GLUCOSE, CAPILLARY
Glucose-Capillary: 177 mg/dL — ABNORMAL HIGH (ref 70–99)
Glucose-Capillary: 156 mg/dL — ABNORMAL HIGH (ref 70–99)
Glucose-Capillary: 97 mg/dL (ref 70–99)
Glucose-Capillary: 118 mg/dL — ABNORMAL HIGH (ref 70–99)
Glucose-Capillary: 180 mg/dL — ABNORMAL HIGH (ref 70–99)

## 2024-02-10 LAB — PHOSPHORUS: Phosphorus: 3.7 mg/dL (ref 2.5–4.6)

## 2024-02-10 LAB — VITAMIN A: Vitamin A (Retinoic Acid): 12.3 ug/dL — ABNORMAL LOW (ref 20.1–62.0)

## 2024-02-10 LAB — VITAMIN E
Vitamin E (Alpha Tocopherol): 6.7 mg/L — ABNORMAL LOW (ref 7.0–25.1)
Vitamin E(Gamma Tocopherol): 0.6 mg/L (ref 0.5–5.5)

## 2024-02-10 LAB — VITAMIN B1: Vitamin B1 (Thiamine): 203.5 nmol/L — ABNORMAL HIGH (ref 66.5–200.0)

## 2024-02-10 MED ORDER — SODIUM CHLORIDE 0.9% FLUSH
10.0000 mL | Freq: Two times a day (BID) | INTRAVENOUS | Status: DC
  Administered 2024-02-10: 20 mL
  Administered 2024-02-10 – 2024-02-13 (×5): 10 mL
  Administered 2024-02-13: 20 mL
  Administered 2024-02-14 – 2024-02-16 (×6): 10 mL
  Administered 2024-02-17: 20 mL

## 2024-02-10 MED ORDER — SODIUM CHLORIDE 0.9% FLUSH: 10.0000 mL | INTRAVENOUS | Status: DC | PRN

## 2024-02-10 MED ORDER — POTASSIUM CHLORIDE 10 MEQ/100ML IV SOLN
10.0000 meq | INTRAVENOUS | Status: AC
  Administered 2024-02-10 (×2): 10 meq via INTRAVENOUS
  Filled 2024-02-10: qty 100

## 2024-02-10 MED ORDER — POTASSIUM CHLORIDE 10 MEQ/100ML IV SOLN
10.0000 meq | INTRAVENOUS | Status: DC
  Filled 2024-02-10: qty 100

## 2024-02-10 MED ORDER — INSULIN ASPART 100 UNIT/ML IJ SOLN
0.0000 [IU] | INTRAMUSCULAR | Status: DC
  Administered 2024-02-11: 3 [IU] via SUBCUTANEOUS
  Administered 2024-02-11: 2 [IU] via SUBCUTANEOUS
  Administered 2024-02-11: 3 [IU] via SUBCUTANEOUS
  Administered 2024-02-11: 2 [IU] via SUBCUTANEOUS
  Administered 2024-02-11 – 2024-02-12 (×3): 3 [IU] via SUBCUTANEOUS
  Administered 2024-02-12: 2 [IU] via SUBCUTANEOUS
  Administered 2024-02-12: 3 [IU] via SUBCUTANEOUS
  Administered 2024-02-12: 5 [IU] via SUBCUTANEOUS
  Administered 2024-02-12: 3 [IU] via SUBCUTANEOUS
  Administered 2024-02-13: 5 [IU] via SUBCUTANEOUS
  Administered 2024-02-13: 3 [IU] via SUBCUTANEOUS
  Administered 2024-02-13: 2 [IU] via SUBCUTANEOUS
  Administered 2024-02-13 – 2024-02-14 (×6): 3 [IU] via SUBCUTANEOUS
  Administered 2024-02-14 (×2): 5 [IU] via SUBCUTANEOUS
  Administered 2024-02-15: 3 [IU] via SUBCUTANEOUS
  Administered 2024-02-15: 8 [IU] via SUBCUTANEOUS
  Administered 2024-02-15: 2 [IU] via SUBCUTANEOUS
  Administered 2024-02-15: 5 [IU] via SUBCUTANEOUS
  Administered 2024-02-15: 3 [IU] via SUBCUTANEOUS
  Administered 2024-02-16: 5 [IU] via SUBCUTANEOUS
  Administered 2024-02-16 (×2): 3 [IU] via SUBCUTANEOUS
  Administered 2024-02-16: 2 [IU] via SUBCUTANEOUS
  Administered 2024-02-16: 5 [IU] via SUBCUTANEOUS
  Administered 2024-02-17: 3 [IU] via SUBCUTANEOUS
  Administered 2024-02-17: 2 [IU] via SUBCUTANEOUS
  Administered 2024-02-17: 3 [IU] via SUBCUTANEOUS
  Filled 2024-02-10 (×33): qty 1

## 2024-02-10 MED ORDER — CHLORHEXIDINE GLUCONATE CLOTH 2 % EX PADS
6.0000 | MEDICATED_PAD | Freq: Every day | CUTANEOUS | Status: DC
  Administered 2024-02-10 – 2024-02-16 (×6): 6 via TOPICAL

## 2024-02-10 MED ORDER — ZINC CHLORIDE 1 MG/ML IV SOLN
INTRAVENOUS | Status: AC
  Filled 2024-02-10: qty 561.6

## 2024-02-10 MED ORDER — MAGNESIUM SULFATE 2 GM/50ML IV SOLN
2.0000 g | Freq: Once | INTRAVENOUS | Status: AC
  Administered 2024-02-10: 2 g via INTRAVENOUS
  Filled 2024-02-10: qty 50

## 2024-02-10 NOTE — Progress Notes (Signed)
 Progress Note   Patient: Christian Mathis FMW:[id number] DOB: 1976/06/03 DOA: 02/06/2024     4 DOS: the patient was seen and examined on 02/10/2024   Brief hospital course: Taken for H&P.  Christian Mathis is a 48 y.o. male with medical history significant of IIDM, diabetic neuropathy, status post Roux-en-Y gastric bypass, presented with severe sudden onset of abdominal pain.  Patient woke up this morning about 2 AM with severe sharp like abdominal pain 10/10,  all over belly, constant.   On presentation hypotensive at 79/57 and was given 2 L of IV bolus.  Labs with leukocytosis at 12.1, hemoglobin 16, bicarb 21. CTA chest abdomen pelvis showed signs of bowel perforation and free air in peritoneal cavity.  General surgery was consulted and patient was taken to the OR emergently s/p exploratory laparotomy with small bowel resection and feeding jejunostomy creation.  7/15: Hemodynamically stable.  Labs with improving leukocytosis at 12.7, hemoglobin 13.4.  UA was negative.  UDS was done this morning and positive for opioids and benzodiazepines which he got in the ED. A1c of 7.2, preliminary blood cultures negative in 12 hours.  Pending surgical pathology. Surgery is planning to start trickle feed through jejunostomy feeding tube.  7/16: Hemodynamically stable, started on trickle feed, general surgery is going to do a contrast study to rule out any anastomosis leak before removing NG tube. Wound VAC and drain in place.  Nutritional labs with folate of 6.5 and rest pending.  No BM yet.  7/17: Vitals with mildly elevated blood pressure, worsening abdominal pain, no flatus or BM.  Having some nausea and vomiting, NG tube was removed earlier.  Upper GI studies and CT abdomen from yesterday confirm no anastomosis leak.  Concern of developing postoperative ileus-tube feed was held and NG tube replacement was ordered.  7/18: Significant bilious secretions with NG tube, surgery ordered TPN and patient will  remain n.p.o. as still no bowel function.  Mild hypokalemia and borderline magnesium which are being repleted   Assessment and Plan: * Bowel perforation [facility]) Patient met severe sepsis criteria with tachycardia, leukocytosis and lactic acidosis, secondary to acute peritonitis from bowel rupture. S/p exploratory laparotomy and small bowel resection with jejunostomy feeding feeding tube placement.  Preliminary blood cultures negative General surgery is on board and managing Upper GI series and CT done on 7/16 was negative for any anastomosis leak Labs seem stable  - Continue with Zosyn  -Follow recommendations by general surgery  Postoperative ileus (HCC) Patient developed worsening abdominal pain with vomiting.  Tube feed was held and NG tube was replaced.  Repeat imaging with concern of increased fluid fill dilation of small bowel likely ileus.  No extraluminal contrast leak. -Patient to remain n.p.o. -Continue with NG tube suctioning -TPN will be started today-PICC line was placed -Supportive care  Diabetes mellitus without complication (HCC) CBG currently within goal, A1c of 7.2. Keep holding home metformin -Continue with SSI  Neuropathy No acute concern. - Will resume home gabapentin  once able to take p.o. or by jejunostomy feeding tube  Alcohol abuse - Continue with CIWA protocol  COPD (chronic obstructive pulmonary disease) (HCC) No acute concern. - As needed bronchodilator  Protein-calorie malnutrition, severe Estimated body mass index is 19.63 kg/m as calculated from the following:   Height as of this encounter: 5' 9 (1.753 m).   Weight as of this encounter: 60.3 kg.   - Dietitian was consulted -TPN to be started today  Subjective: Patient continued to have some abdominal pain  but stating it is better than yesterday.  Significant bilious secretions through NG tube.  Still no bowel movement or passing flatus.  Physical Exam: Vitals:   02/09/24 1615 02/09/24 2038  02/10/24 0343 02/10/24 0500  BP: (!) 140/102 124/85 (!) 139/91   Pulse: (!) 104 (!) 101 77   Resp:  16 16   Temp:  97.8 F (36.6 C) 97.7 F (36.5 C)   TempSrc:      SpO2: 98% 98% 99%   Weight:    60.3 kg  Height:       General.  Malnourished gentleman, in no acute distress.  NG tube in place Pulmonary.  Lungs clear bilaterally, normal respiratory effort. CV.  Regular rate and rhythm, no JVD, rub or murmur. Abdomen.  Soft, mild diffuse tenderness, central wound VAC, right-sided drain and left-sided jejunostomy tube.  Mildly distended and hypoactive bowel signs. CNS.  Alert and oriented .  No focal neurologic deficit. Extremities.  No edema,  pulses intact and symmetrical. Psychiatry.  Judgment and insight appears normal.   Data Reviewed: Prior data reviewed  Family Communication: Discussed with patient  Disposition: Status is: Inpatient Remains inpatient appropriate because: Severity of illness  Planned Discharge Destination: Home  DVT prophylaxis.  Lovenox  Time spent: 50 minutes  This record has been created using Conservation officer, historic buildings. Errors have been sought and corrected,but may not always be located. Such creation errors do not reflect on the standard of care.   Author: Amaryllis Dare, MD 02/10/2024 3:14 PM  For on call review [email or web address].

## 2024-02-10 NOTE — Progress Notes (Signed)
 Peripherally Inserted Central Catheter Placement  The IV Nurse has discussed with the patient and/or persons authorized to consent for the patient, the purpose of this procedure and the potential benefits and risks involved with this procedure.  The benefits include less needle sticks, lab draws from the catheter, and the patient may be discharged home with the catheter. Risks include, but not limited to, infection, bleeding, blood clot (thrombus formation), and puncture of an artery; nerve damage and irregular heartbeat and possibility to perform a PICC exchange if needed/ordered by physician.  Alternatives to this procedure were also discussed.  Bard Power PICC patient education guide, fact sheet on infection prevention and patient information card has been provided to patient /or left at bedside.  ECG printed and placed on chart.  PICC Placement Documentation  PICC Double Lumen 02/10/24 Right Basilic 37 cm 0 cm (Active)  Indication for Insertion or Continuance of Line Administration of hyperosmolar/irritating solutions (i.e. TPN, Vancomycin , etc.) 02/10/24 0918  Exposed Catheter (cm) 0 cm 02/10/24 0918  Site Assessment Clean, Dry, Intact 02/10/24 0918  Lumen #1 Status Flushed;Saline locked;Blood return noted 02/10/24 0918  Lumen #2 Status Flushed;Saline locked;Blood return noted 02/10/24 0918  Dressing Type Transparent;Securing device 02/10/24 9081  Dressing Status Antimicrobial disc/dressing in place 02/10/24 9081  Line Care Connections checked and tightened 02/10/24 9081  Line Adjustment (NICU/IV Team Only) No 02/10/24 0918  Dressing Intervention New dressing;Adhesive placed at insertion site (IV team only) 02/10/24 0918  Dressing Change Due 02/17/24 02/10/24 0918       Leita Shipper 02/10/2024, 9:19 AM

## 2024-02-10 NOTE — Plan of Care (Signed)

## 2024-02-10 NOTE — Consult Note (Signed)
 PHARMACY - TOTAL PARENTERAL NUTRITION CONSULT NOTE   Indication: Prolonged ileus  Patient Measurements: Height: 5' 9 (175.3 cm) Weight: 60.3 kg (132 lb 15 oz) IBW/kg (Calculated) : 70.7 TPN AdjBW (KG): 65.8  Assessment:  IIDM, diabetic neuropathy, status post Roux-en-Y gastric bypass, and alcohol use disorder. Patient taken to OR on 7/14 for emergent surgery emergently s/p exploratory laparotomy with small bowel resection and feeding jejunostomy creation. 2ry to perforated intestine. Patient with prolonged post-op ileus and NPO for 4 days. Pharmacy consulted to start and manage TPN.  Glucose / Insulin : Diabetic on SSI and A1C 7.2% Electrolytes:  Mg = 1.7; provider replaced with MgSulfate 2gm IV x 1 dose K=3.2;  main IVF with 20 meq Kcl plus Kcl 10meq IC x 1 hrs x 2 dose ordered by provider. Renal: Scr 0.63 Hepatic: LFTs - WNL Intake / Output; MIVF: D5Ns with @ 75 ml/hr GI Imaging: CT performed 7/17  Increased fluid-filled dilation of small-bowel loops, likely ileus. Bilateral pleural effusions, unchanged CT negative for anastomosis leak GI Surgeries / Procedures:   Central access: PICC line 7/18 TPN start date: 7/18  Nutritional Goals: Goal TPN rate is 90 mL/hr (provides 112.3 g of protein and 2198 kcals per day)  RD Assessment: Estimated Needs Total Energy Estimated Needs: 2000-2300kcal/day Total Protein Estimated Needs: 100-115g/day Total Fluid Estimated Needs: 2.0-2.3L/day  Current Nutrition: NPO  Plan:  Start TPN at 45 mL/hr at 1800 Electrolytes in TPN: Na 73mEq/L, K 4mEq/L, Ca 81mEq/L, Mg 84mEq/L, and Phos 15mmol/L. Cl:Ac 1:1 Add standard MVI and trace elements to TPN Add Zinc  5mg  to IV bag per dietitian recommendation Daily thiamine  outside bag already ordered by provider Change Ssi to  Moderate q4h SSI and adjust as needed  DC main IVF once order expires at 1800 (okay by MD) Monitor TPN labs on Mon/Thurs, and daily until stable.  Mindy Gali  Rodriguez-Guzman PharmD, BCPS 02/10/2024 8:06 AM

## 2024-02-10 NOTE — Progress Notes (Signed)
 [facility]- General Surgery  SURGICAL PROGRESS NOTE  Hospital Day(s): 4.   Post op day(s): 4 Days Post-Op.   Interval History:  Patient seen and examined. Patient states he is feeling much better. CT of abdomen from yesterday showed fluid-filled dilation of small bowel consistent with ileus, no anastomotic leak. Patient has not had a bowel movement or has experienced passing gas. NG tube is still in placed. Patient is having ice chips but otherwise NPO. JP output has decreased by half.    Vital signs in last 24 hours: [min-max] current  Temp:  [97.7 F (36.5 C)-98.4 F (36.9 C)] 97.7 F (36.5 C) (07/18 0343) Pulse Rate:  [77-116] 77 (07/18 0343) Resp:  [16] 16 (07/18 0343) BP: (124-140)/(85-110) 139/91 (07/18 0343) SpO2:  [98 %-99 %] 99 % (07/18 0343) Weight:  [60.3 kg] 60.3 kg (07/18 0500)     Height: 5' 9 (175.3 cm) Weight: 60.3 kg BMI (Calculated): 19.62   Intake/Output last 2 shifts:  07/17 0701 - 07/18 0700 In: 2629.5 [P.O.:240; I.V.:669.7; NG/GT:1258.7; IV Piggyback:461.1] Out: 3425 [Urine:300; Emesis/NG output:1900; Drains:1225]   Physical Exam:  Constitutional: alert, cooperative and no distress  Respiratory: breathing non-labored at rest  Cardiovascular: regular rate and sinus rhythm  Gastrointestinal: soft, mildly tender, and less distended. Prevena intact. JP drain had minimal serous output.   Labs:     Latest Ref Rng & Units 02/07/2024    4:27 AM 02/06/2024    9:37 PM 02/06/2024    5:42 AM  CBC  WBC 4.0 - 10.5 K/uL 12.7  14.4  12.1   Hemoglobin 13.0 - 17.0 g/dL 86.5  86.8  83.7   Hematocrit 39.0 - 52.0 % 38.4  37.3  47.4   Platelets 150 - 400 K/uL 218  212  343       Latest Ref Rng & Units 02/10/2024    6:06 AM 02/09/2024    5:23 AM 02/08/2024    5:30 AM  CMP  Glucose 70 - 99 mg/dL 835  821  863   BUN 6 - 20 mg/dL 6  6  9    Creatinine 0.61 - 1.24 mg/dL 9.35  9.52  9.37   Sodium 135 - 145 mmol/L 135  132  140   Potassium 3.5 - 5.1 mmol/L 3.2  3.5  3.6    Chloride 98 - 111 mmol/L 98  98  103   CO2 22 - 32 mmol/L 30  28  25    Calcium 8.9 - 10.3 mg/dL 8.3  8.5  8.9     Imaging studies:  CLINICAL DATA:  Bowel perforation status post exploratory laparotomy and small-bowel resection. Acute onset severe abdominal pain and increased drain output.   EXAM: 02/09/24 CT ABDOMEN WITH CONTRAST   TECHNIQUE: Multidetector CT imaging of the abdomen was performed using the standard protocol following bolus administration of intravenous contrast.   RADIATION DOSE REDUCTION: This exam was performed according to the departmental dose-optimization program which includes automated exposure control, adjustment of the mA and/or kV according to patient size and/or use of iterative reconstruction technique.   CONTRAST:  OMNIPAQUE  IOHEXOL  300 MG/ML SOLN, OMNIPAQUE  IOHEXOL  300 MG/ML SOLN   COMPARISON:  CT abdomen dated 02/08/2024, CTA abdomen and pelvis dated 02/06/2024   FINDINGS: Lower chest: No focal consolidation or pulmonary nodule in the lung bases. Bilateral pleural effusions, unchanged. Partially imaged heart size is normal.   Hepatobiliary: No focal hepatic lesions. No intra or extrahepatic biliary ductal dilation. Cholecystectomy.   Pancreas: No  focal lesions or main ductal dilation.   Spleen: Normal in size without focal abnormality.   Adrenals/Urinary Tract: No adrenal nodules. No suspicious renal masses, calculi or hydronephrosis.   Stomach/Bowel: Enteric tube terminates in the gastric pouch. Postsurgical changes of Roux-en-Y gastric bypass. High attenuation contrast material within the stomach and small bowel resultant beam hardening artifact and suboptimal evaluation of adjacent structures. The proximal post anastomotic small bowel is underdistended with apparent mural thickening. Enteric contrast reaches the ascending colon. Status post small bowel resection. Increased fluid-filled dilation of small-bowel loops.    Vascular/Lymphatic: No significant vascular findings are present. No enlarged abdominal lymph nodes.   Other: Right lateral approach drainage catheter tip terminates in the left upper quadrant. Decreased small volume intraperitoneal free air. Trace intraperitoneal free fluid.   Musculoskeletal: No acute or abnormal lytic or blastic osseous lesions. Mild body wall edema. Postsurgical changes of the anterior abdominal wall.   IMPRESSION: 1. Increased fluid-filled dilation of small-bowel loops, likely ileus. 2. No extraluminal contrast leak. Underdistended proximal post anastomotic small bowel demonstrates mural thickening, which may be postsurgical. 3. Decreased small volume intraperitoneal free air. 4. Bilateral pleural effusions, unchanged.     Electronically Signed   By: Limin  Xu M.D.   On: 02/09/2024 17:51   Assessment/Plan:  48 y.o. male with perforation of intestine 4 Days Post-Op s/p  for exploratory laparotomy with small bowel resection and feeding jejunostomy creation, complicated by pertinent comorbidities including alcohol use disorder, diabetes, and neuropathy.    - Stable vital signs   - CT of abdomen was negative for anastomosis leak  - Continue IV Zosyn   - Encouraged patient to ambulate   - Continue pain management  Post-op ileus   - Continue conservative management for ileus, continue NG tube and IV fluids   - Continue NPO until bowel function returns. Allow ice chips, NGT output may not be accurate   - Continue to monitor JP drain output   - PICC line to be placed today and start TPN   -- Gilmar Bua [employer]

## 2024-02-10 NOTE — Progress Notes (Signed)
 Patient still continuously report 6-8/10 pain and managed with PRN Dilaudid  every 2 hours.Patient denies nausea and vomiting overnight.NGT connected to ILWS with 1 liter of greenish output overnight.Jejunostomy tube to gravity with of greenish output overnight.JP drain with decreasing serous fluid.VSS.Ambulates in the room with one standby assist.Fall precautions in place On MIVF.Call bell within reach.Educated to call for assistance when needed.

## 2024-02-10 NOTE — Assessment & Plan Note (Signed)
 CBG currently within goal, A1c of 7.2. Keep holding home metformin -Continue with SSI

## 2024-02-10 NOTE — Assessment & Plan Note (Signed)
 Estimated body mass index is 19.63 kg/m as calculated from the following:   Height as of this encounter: 5' 9 (1.753 m).   Weight as of this encounter: 60.3 kg.   - Dietitian was consulted -TPN to be started today

## 2024-02-11 ENCOUNTER — Inpatient Hospital Stay

## 2024-02-11 DIAGNOSIS — E43 Unspecified severe protein-calorie malnutrition: Secondary | ICD-10-CM | POA: Diagnosis not present

## 2024-02-11 DIAGNOSIS — K631 Perforation of intestine (nontraumatic): Secondary | ICD-10-CM | POA: Diagnosis not present

## 2024-02-11 DIAGNOSIS — K659 Peritonitis, unspecified: Secondary | ICD-10-CM | POA: Diagnosis not present

## 2024-02-11 DIAGNOSIS — K9189 Other postprocedural complications and disorders of digestive system: Secondary | ICD-10-CM

## 2024-02-11 DIAGNOSIS — K567 Ileus, unspecified: Secondary | ICD-10-CM

## 2024-02-11 LAB — CBC
HCT: 35.3 % — ABNORMAL LOW (ref 39.0–52.0)
Hemoglobin: 12.2 g/dL — ABNORMAL LOW (ref 13.0–17.0)
MCH: 30.3 pg (ref 26.0–34.0)
MCHC: 34.6 g/dL (ref 30.0–36.0)
MCV: 87.8 fL (ref 80.0–100.0)
Platelets: 216 K/uL (ref 150–400)
RBC: 4.02 MIL/uL — ABNORMAL LOW (ref 4.22–5.81)
RDW: 13.1 % (ref 11.5–15.5)
WBC: 8.8 K/uL (ref 4.0–10.5)
nRBC: 0 % (ref 0.0–0.2)

## 2024-02-11 LAB — GLUCOSE, CAPILLARY
Glucose-Capillary: 150 mg/dL — ABNORMAL HIGH (ref 70–99)
Glucose-Capillary: 158 mg/dL — ABNORMAL HIGH (ref 70–99)
Glucose-Capillary: 145 mg/dL — ABNORMAL HIGH (ref 70–99)
Glucose-Capillary: 179 mg/dL — ABNORMAL HIGH (ref 70–99)
Glucose-Capillary: 178 mg/dL — ABNORMAL HIGH (ref 70–99)

## 2024-02-11 LAB — BASIC METABOLIC PANEL WITH GFR
Anion gap: 6 (ref 5–15)
BUN: 7 mg/dL (ref 6–20)
CO2: 31 mmol/L (ref 22–32)
Calcium: 8.3 mg/dL — ABNORMAL LOW (ref 8.9–10.3)
Chloride: 100 mmol/L (ref 98–111)
Creatinine, Ser: 0.55 mg/dL — ABNORMAL LOW (ref 0.61–1.24)
GFR, Estimated: >60 mL/min (ref >60)
Glucose, Bld: 134 mg/dL — ABNORMAL HIGH (ref 70–99)
Potassium: 3 mmol/L — ABNORMAL LOW (ref 3.5–5.1)
Sodium: 137 mmol/L (ref 135–145)

## 2024-02-11 LAB — CULTURE, BLOOD (ROUTINE X 2)
Culture: NO GROWTH
Culture: NO GROWTH

## 2024-02-11 LAB — MAGNESIUM: Magnesium: 1.8 mg/dL (ref 1.7–2.4)

## 2024-02-11 LAB — PHOSPHORUS: Phosphorus: 3.5 mg/dL (ref 2.5–4.6)

## 2024-02-11 MED ORDER — POTASSIUM CHLORIDE 10 MEQ/100ML IV SOLN
10.0000 meq | INTRAVENOUS | Status: AC
  Administered 2024-02-11 (×4): 10 meq via INTRAVENOUS
  Filled 2024-02-11 (×2): qty 100

## 2024-02-11 MED ORDER — ZINC CHLORIDE 1 MG/ML IV SOLN
INTRAVENOUS | Status: AC
  Filled 2024-02-11: qty 561.6

## 2024-02-11 MED ORDER — MAGIC MOUTHWASH W/LIDOCAINE
2.0000 mL | Freq: Two times a day (BID) | ORAL | Status: AC
  Administered 2024-02-11 – 2024-02-12 (×2): 2 mL via ORAL
  Filled 2024-02-11 (×4): qty 2

## 2024-02-11 MED ORDER — POLYETHYLENE GLYCOL 3350 17 G PO PACK
17.0000 g | PACK | Freq: Every day | ORAL | Status: DC
  Administered 2024-02-11 – 2024-02-17 (×6): 17 g via NASOGASTRIC
  Filled 2024-02-11 (×6): qty 1

## 2024-02-11 NOTE — Progress Notes (Signed)
 [facility]  - [place] at [facility]   PATIENT NAME: Christian Mathis    MR#:  [id number]  DATE OF BIRTH:  03/29/1976  SUBJECTIVE:  no family at bedside. Patient connected back to NG tube section. TPN going well.    VITALS:  Blood pressure (!) 141/90, pulse 66, temperature 98 F (36.7 C), resp. rate 16, height 5' 9 (1.753 m), weight 59.9 kg, SpO2 100%.  PHYSICAL EXAMINATION:   GENERAL:  48 y.o.-year-old patient with no acute distress. NG+ LUNGS: Normal breath sounds bilaterally, no wheezing CARDIOVASCULAR: S1, S2 normal. No murmur   ABDOMEN: abdominal wound VAC and two drains present.  EXTREMITIES: No  edema b/l.   PICC + NEUROLOGIC: nonfocal  patient is alert and awake   LABORATORY PANEL:  CBC Recent Labs  Lab 02/11/24 0538  WBC 8.8  HGB 12.2*  HCT 35.3*  PLT 216    Chemistries  Recent Labs  Lab 02/06/24 0542 02/07/24 0427 02/11/24 0538  NA 139   < > 137  K 4.0   < > 3.0*  CL 104   < > 100  CO2 21*   < > 31  GLUCOSE 155*   < > 134*  BUN 8   < > 7  CREATININE 0.69   < > 0.55*  CALCIUM 8.9   < > 8.3*  MG  --    < > 1.8  AST 23  --   --   ALT 34  --   --   ALKPHOS 96  --   --   BILITOT 0.8  --   --    < > = values in this interval not displayed.   Cardiac Enzymes No results for input(s): TROPONINI in the last 168 hours. RADIOLOGY:  DG Abd 2 Views Result Date: 02/11/2024 CLINICAL DATA:  [id number] Ileus following gastrointestinal surgery [facility]) [id number] EXAM: ABDOMEN - 2 VIEW COMPARISON:  02/09/2024 FINDINGS: Visualized lung bases clear. Gastric tube has its proximal side hole in the distal esophagus. No free air. Stomach and small bowel appear decompressed. Residual contrast in the descending colon without dilatation. Surgical drain in the upper abdomen. Nonspecific multi sidehole catheter left mid abdomen. Midline skin staples. No abnormal abdominal calcifications. Regional bones unremarkable. IMPRESSION: 1. Nonobstructive bowel gas pattern. 2.  Gastric tube with proximal side hole in the distal esophagus. Electronically Signed   By: JONETTA Faes M.D.   On: 02/11/2024 13:44   CT ABDOMEN W CONTRAST Result Date: 02/09/2024 CLINICAL DATA:  Bowel perforation status post exploratory laparotomy and small-bowel resection. Acute onset severe abdominal pain and increased drain output. EXAM: CT ABDOMEN WITH CONTRAST TECHNIQUE: Multidetector CT imaging of the abdomen was performed using the standard protocol following bolus administration of intravenous contrast. RADIATION DOSE REDUCTION: This exam was performed according to the departmental dose-optimization program which includes automated exposure control, adjustment of the mA and/or kV according to patient size and/or use of iterative reconstruction technique. CONTRAST:  OMNIPAQUE  IOHEXOL  300 MG/ML SOLN, OMNIPAQUE  IOHEXOL  300 MG/ML SOLN COMPARISON:  CT abdomen dated 02/08/2024, CTA abdomen and pelvis dated 02/06/2024 FINDINGS: Lower chest: No focal consolidation or pulmonary nodule in the lung bases. Bilateral pleural effusions, unchanged. Partially imaged heart size is normal. Hepatobiliary: No focal hepatic lesions. No intra or extrahepatic biliary ductal dilation. Cholecystectomy. Pancreas: No focal lesions or main ductal dilation. Spleen: Normal in size without focal abnormality. Adrenals/Urinary Tract: No adrenal nodules. No suspicious renal masses, calculi or hydronephrosis. Stomach/Bowel: Enteric tube terminates in  the gastric pouch. Postsurgical changes of Roux-en-Y gastric bypass. High attenuation contrast material within the stomach and small bowel resultant beam hardening artifact and suboptimal evaluation of adjacent structures. The proximal post anastomotic small bowel is underdistended with apparent mural thickening. Enteric contrast reaches the ascending colon. Status post small bowel resection. Increased fluid-filled dilation of small-bowel loops. Vascular/Lymphatic: No significant  vascular findings are present. No enlarged abdominal lymph nodes. Other: Right lateral approach drainage catheter tip terminates in the left upper quadrant. Decreased small volume intraperitoneal free air. Trace intraperitoneal free fluid. Musculoskeletal: No acute or abnormal lytic or blastic osseous lesions. Mild body wall edema. Postsurgical changes of the anterior abdominal wall. IMPRESSION: 1. Increased fluid-filled dilation of small-bowel loops, likely ileus. 2. No extraluminal contrast leak. Underdistended proximal post anastomotic small bowel demonstrates mural thickening, which may be postsurgical. 3. Decreased small volume intraperitoneal free air. 4. Bilateral pleural effusions, unchanged. Electronically Signed   By: Limin  Xu M.D.   On: 02/09/2024 17:51   US  EKG SITE RITE Result Date: 02/09/2024 If Site Rite image not attached, placement could not be confirmed due to current cardiac rhythm.  Assessment and Plan Christian Mathis is a 48 y.o. male with medical history significant of IIDM, diabetic neuropathy, status post Roux-en-Y gastric bypass, presented with severe sudden onset of abdominal pain.  Patient woke up this morning about 2 AM with severe sharp like abdominal pain 10/10,  all over belly, constant.   CTA chest abdomen pelvis showed signs of bowel perforation and free air in peritoneal cavity.   General surgery was consulted and patient was taken to the OR emergently s/p exploratory laparotomy with small bowel resection and feeding jejunostomy creation.  7/19-- patient with ongoing NG tube section due to postop ileus. Tolerating TPN. Continue IV antibiotic.  Bowel perforation (HCC) --Patient met severe sepsis criteria with tachycardia, leukocytosis and lactic acidosis, secondary to acute peritonitis from bowel rupture. --S/p exploratory laparotomy and small bowel resection with jejunostomy feeding feeding tube placement.  Preliminary blood cultures negative --General surgery is  on board and managing --Upper GI series and CT done yesterday was negative for any anastomosis leak -- Continue with Zosyn  -Follow recommendations by general surgery--now on TPN   Postoperative ileus (HCC) --Patient developed worsening abdominal pain with vomiting.  Tube feed was held and NG tube replacement was ordered. --No flatus or BM-concerning for developing postoperative ileus -Patient to remain n.p.o. -Supportive care, NG suction   Diabetes mellitus without complication (HCC) --CBG currently within goal, A1c of 7.2. Keep holding home metformin  -Continue with SSI   Neuropathy --No acute concern. - Will resume home gabapentin  once able to take p.o. or by jejunostomy feeding tube   Alcohol abuse - Continue with CIWA protocol   COPD (chronic obstructive pulmonary disease) (HCC) No acute concern. - As needed bronchodilator -- patient advised not to leave the room to go outside the hospital to smoke. He voiced understanding.  Procedures: exploratory laparotomy with small bowel resection and feeding jejunostomy creation Family communication : none Consults : general surgery Dr. Rodolph CODE STATUS: full DVT Prophylaxis : Lovenox  Level of care: Telemetry Medical Status is: Inpatient Remains inpatient appropriate because: bowel perforation. Currently on TPN    TOTAL TIME TAKING CARE OF THIS PATIENT: 45 minutes.  >50% time spent on counselling and coordination of care  Note: This dictation was prepared with Dragon dictation along with smaller phrase technology. Any transcriptional errors that result from this process are unintentional.  Leita Tobie HERO.D  [facility]   CC: Primary care physician; Practice, [facility]

## 2024-02-11 NOTE — Consult Note (Signed)
 PHARMACY - TOTAL PARENTERAL NUTRITION CONSULT NOTE   Indication: Prolonged ileus  Patient Measurements: Height: 5' 9 (175.3 cm) Weight: 59.9 kg (132 lb 0.9 oz) IBW/kg (Calculated) : 70.7 TPN AdjBW (KG): 65.8  Assessment:  IIDM, diabetic neuropathy, status post Roux-en-Y gastric bypass, and alcohol use disorder. Patient taken to OR on 7/14 for emergent surgery emergently s/p exploratory laparotomy with small bowel resection and feeding jejunostomy creation. 2ry to perforated intestine. Patient with prolonged post-op ileus and NPO for 4 days. Pharmacy consulted to start and manage TPN.  Glucose / Insulin : Diabetic on SSI and A1C 7.2%. BG 158. 11 units of insulin .  Electrolytes:  Mg = 1.8; provider replaced with MgSulfate 2gm IV x 1 dose K=3.2 > 3;  main IVF with 20 meq Kcl plus Kcl 10meq IC x 1 hrs x 2 dose ordered by provider. Renal: Scr 0.63 > 0.55 Hepatic: LFTs - WNL Intake / Output; MIVF: D5Ns with @ 75 ml/hr GI Imaging: CT performed 7/17  Increased fluid-filled dilation of small-bowel loops, likely ileus. Bilateral pleural effusions, unchanged CT negative for anastomosis leak GI Surgeries / Procedures:   Central access: PICC line 7/18 TPN start date: 7/18  Nutritional Goals: Goal TPN rate is 90 mL/hr (provides 112.3 g of protein and 2198 kcals per day)  RD Assessment: Estimated Needs Total Energy Estimated Needs: 2000-2300kcal/day Total Protein Estimated Needs: 100-115g/day Total Fluid Estimated Needs: 2.0-2.3L/day  Current Nutrition: NPO  Plan:  Continue TPN at 45 mL/hr at 1800 due to refeeding risk.  Electrolytes in TPN: Na 45mEq/L, K 71mEq/L, Ca 32mEq/L, Mg 91mEq/L, and Phos 15mmol/L. Cl:Ac 1:1 Add standard MVI and trace elements to TPN Add Zinc  5mg  to IV bag per dietitian recommendation Daily thiamine  outside bag already ordered by provider for alcohol withdrawal.  Change SSI to  Moderate q4h SSI and adjust as needed  DC main IVF once order expires at  1800 (okay by MD) Monitor TPN labs on Mon/Thurs, and daily until stable.  Cathaleen Blanch, PharmD, BCPS 02/11/2024 8:59 AM

## 2024-02-11 NOTE — Plan of Care (Signed)
  Problem: Coping: Goal: Ability to adjust to condition or change in health will improve Outcome: Progressing   Problem: Fluid Volume: Goal: Ability to maintain a balanced intake and output will improve Outcome: Progressing   Problem: Health Behavior/Discharge Planning: Goal: Ability to manage health-related needs will improve Outcome: Progressing   Problem: Metabolic: Goal: Ability to maintain appropriate glucose levels will improve Outcome: Progressing   Problem: Nutritional: Goal: Maintenance of adequate nutrition will improve Outcome: Progressing Goal: Progress toward achieving an optimal weight will improve Outcome: Progressing   Problem: Tissue Perfusion: Goal: Adequacy of tissue perfusion will improve Outcome: Progressing   Problem: Education: Goal: Knowledge of General Education information will improve Description: Including pain rating scale, medication(s)/side effects and non-pharmacologic comfort measures Outcome: Progressing   Problem: Health Behavior/Discharge Planning: Goal: Ability to manage health-related needs will improve Outcome: Progressing   Problem: Clinical Measurements: Goal: Will remain free from infection Outcome: Progressing Goal: Diagnostic test results will improve Outcome: Progressing   Problem: Nutrition: Goal: Adequate nutrition will be maintained Outcome: Progressing   Problem: Coping: Goal: Level of anxiety will decrease Outcome: Progressing   Problem: Elimination: Goal: Will not experience complications related to urinary retention Outcome: Progressing

## 2024-02-11 NOTE — Progress Notes (Signed)
 Patient ID: Christian Mathis, male   DOB: April 02, 1976, 48 y.o.   MRN: [id number]     SURGICAL PROGRESS NOTE   Hospital Day(s): 5.   Interval History: Patient seen and examined, no acute events or new complaints overnight. Patient reports feeling sore but still under control.  He denies any nausea but has not been passing any gas or bowel movement  Vital signs in last 24 hours: [min-max] current  Temp:  [98 F (36.7 C)-98.4 F (36.9 C)] 98 F (36.7 C) (07/19 0811) Pulse Rate:  [66-86] 66 (07/19 0811) Resp:  [16-19] 16 (07/19 0811) BP: (132-150)/(77-96) 141/90 (07/19 0811) SpO2:  [98 %-100 %] 100 % (07/19 0811) Weight:  [59.9 kg] 59.9 kg (07/19 0354)     Height: 5' 9 (175.3 cm) Weight: 59.9 kg BMI (Calculated): 19.49   Physical Exam:  Constitutional: alert, cooperative and no distress  Respiratory: breathing non-labored at rest  Cardiovascular: regular rate and sinus rhythm  Gastrointestinal: soft, non-tender, and non-distended drain is serous  Labs:     Latest Ref Rng & Units 02/11/2024    5:38 AM 02/07/2024    4:27 AM 02/06/2024    9:37 PM  CBC  WBC 4.0 - 10.5 K/uL 8.8  12.7  14.4   Hemoglobin 13.0 - 17.0 g/dL 87.7  86.5  86.8   Hematocrit 39.0 - 52.0 % 35.3  38.4  37.3   Platelets 150 - 400 K/uL 216  218  212       Latest Ref Rng & Units 02/11/2024    5:38 AM 02/10/2024    6:06 AM 02/09/2024    5:23 AM  CMP  Glucose 70 - 99 mg/dL 865  835  821   BUN 6 - 20 mg/dL 7  6  6    Creatinine 0.61 - 1.24 mg/dL 9.44  9.35  9.52   Sodium 135 - 145 mmol/L 137  135  132   Potassium 3.5 - 5.1 mmol/L 3.0  3.2  3.5   Chloride 98 - 111 mmol/L 100  98  98   CO2 22 - 32 mmol/L 31  30  28    Calcium 8.9 - 10.3 mg/dL 8.3  8.3  8.5     Imaging studies: No new pertinent imaging studies   Assessment/Plan:  Perforation of short intestinal Roux limb - S/p small bowel resection and anastomosis postoperative day #5 - Upper GI series and CT scan of the abdomen POD#2 negative for leak from the  anastomosis, no sign of obstruction - Post of day #3 had sudden severe abdominal pain and significant increase in serous drainage through JP. Second CT scan on POD #3 again negative for leak or obstruction.  - No recurrent abscesses.  Abdominal pain.  Still sore but under control -Still no return of GI function.  Will order abdominal x-ray for assessment of ileus. - Will add laxative - Continue TPN - Continue IV antibiotic therapy due to peritonitis - Encourage patient to get out of bed - Continue pain management  Lucas Petrin, MD

## 2024-02-12 DIAGNOSIS — K631 Perforation of intestine (nontraumatic): Secondary | ICD-10-CM | POA: Diagnosis not present

## 2024-02-12 LAB — RENAL FUNCTION PANEL
Albumin: 2.6 g/dL — ABNORMAL LOW (ref 3.5–5.0)
Anion gap: 6 (ref 5–15)
BUN: 9 mg/dL (ref 6–20)
CO2: 27 mmol/L (ref 22–32)
Calcium: 8.6 mg/dL — ABNORMAL LOW (ref 8.9–10.3)
Chloride: 99 mmol/L (ref 98–111)
Creatinine, Ser: 0.58 mg/dL — ABNORMAL LOW (ref 0.61–1.24)
GFR, Estimated: >60 mL/min (ref >60)
Glucose, Bld: 206 mg/dL — ABNORMAL HIGH (ref 70–99)
Phosphorus: 2.9 mg/dL (ref 2.5–4.6)
Potassium: 3.7 mmol/L (ref 3.5–5.1)
Sodium: 132 mmol/L — ABNORMAL LOW (ref 135–145)

## 2024-02-12 LAB — GLUCOSE, CAPILLARY
Glucose-Capillary: 106 mg/dL — ABNORMAL HIGH (ref 70–99)
Glucose-Capillary: 108 mg/dL — ABNORMAL HIGH (ref 70–99)
Glucose-Capillary: 150 mg/dL — ABNORMAL HIGH (ref 70–99)
Glucose-Capillary: 169 mg/dL — ABNORMAL HIGH (ref 70–99)
Glucose-Capillary: 189 mg/dL — ABNORMAL HIGH (ref 70–99)
Glucose-Capillary: 189 mg/dL — ABNORMAL HIGH (ref 70–99)
Glucose-Capillary: 221 mg/dL — ABNORMAL HIGH (ref 70–99)

## 2024-02-12 LAB — MAGNESIUM: Magnesium: 1.6 mg/dL — ABNORMAL LOW (ref 1.7–2.4)

## 2024-02-12 MED ORDER — ZINC CHLORIDE 1 MG/ML IV SOLN
INTRAVENOUS | Status: AC
  Filled 2024-02-12: qty 1123.2

## 2024-02-12 MED ORDER — MAGNESIUM SULFATE 2 GM/50ML IV SOLN
2.0000 g | Freq: Once | INTRAVENOUS | Status: AC
  Administered 2024-02-12: 2 g via INTRAVENOUS
  Filled 2024-02-12: qty 50

## 2024-02-12 NOTE — Progress Notes (Signed)
 Patient ID: Christian Mathis, male   DOB: Dec 13, 1975, 48 y.o.   MRN: [id number]     SURGICAL PROGRESS NOTE   Hospital Day(s): 6.   Interval History: Patient seen and examined, no acute events or new complaints overnight. Patient reports feeling sore but no worsening abdominal pain.  Patient endorses not passing any gas or having any bowel movement yet.  Vital signs in last 24 hours: [min-max] current  Temp:  [98.3 F (36.8 C)-98.4 F (36.9 C)] 98.4 F (36.9 C) (07/20 0832) Pulse Rate:  [80-86] 84 (07/20 0832) Resp:  [16-17] 16 (07/20 0832) BP: (132-154)/(94-99) 132/96 (07/20 0832) SpO2:  [100 %] 100 % (07/20 0832) Weight:  [58.4 kg] 58.4 kg (07/20 0239)     Height: 5' 9 (175.3 cm) Weight: 58.4 kg BMI (Calculated): 19   Physical Exam:  Constitutional: alert, cooperative and no distress  Respiratory: breathing non-labored at rest  Cardiovascular: regular rate and sinus rhythm  Gastrointestinal: soft, non-tender, and non-distended.  Feeding jejunostomy and right drain in place, no complications.  Labs:     Latest Ref Rng & Units 02/11/2024    5:38 AM 02/07/2024    4:27 AM 02/06/2024    9:37 PM  CBC  WBC 4.0 - 10.5 K/uL 8.8  12.7  14.4   Hemoglobin 13.0 - 17.0 g/dL 87.7  86.5  86.8   Hematocrit 39.0 - 52.0 % 35.3  38.4  37.3   Platelets 150 - 400 K/uL 216  218  212       Latest Ref Rng & Units 02/12/2024    5:35 AM 02/11/2024    5:38 AM 02/10/2024    6:06 AM  CMP  Glucose 70 - 99 mg/dL 793  865  835   BUN 6 - 20 mg/dL 9  7  6    Creatinine 0.61 - 1.24 mg/dL 9.41  9.44  9.35   Sodium 135 - 145 mmol/L 132  137  135   Potassium 3.5 - 5.1 mmol/L 3.7  3.0  3.2   Chloride 98 - 111 mmol/L 99  100  98   CO2 22 - 32 mmol/L 27  31  30    Calcium 8.9 - 10.3 mg/dL 8.6  8.3  8.3     Imaging studies: Abdominal x-ray yesterday shows nonobstructive bowel pattern.  Contrast in large intestine.   Assessment/Plan:  Perforation of short intestinal Roux limb - S/p small bowel resection and  anastomosis postoperative day #6 - Upper GI series and CT scan of the abdomen POD#2 negative for leak from the anastomosis, no sign of obstruction - Post of day #3 had sudden severe abdominal pain and significant increase in serous drainage through JP. Second CT scan on POD #3 again negative for leak or obstruction.  - Still sore but under control -Still no return of GI function.   - Abdominal x-ray yesterday shows nonobstructing bowel gas pattern.  Contrast in large intestine. -Will clamp NGT today - Continue MiraLAX  - Continue TPN - Continue IV antibiotic therapy due to peritonitis - Encourage patient to get out of bed - Continue pain management  Lucas Petrin, MD

## 2024-02-12 NOTE — Plan of Care (Signed)

## 2024-02-12 NOTE — Consult Note (Signed)
 PHARMACY - TOTAL PARENTERAL NUTRITION CONSULT NOTE   Indication: Prolonged ileus  Patient Measurements: Height: 5' 9 (175.3 cm) Weight: 58.4 kg (128 lb 12 oz) IBW/kg (Calculated) : 70.7 TPN AdjBW (KG): 65.8  Assessment:  IIDM, diabetic neuropathy, status post Roux-en-Y gastric bypass, and alcohol use disorder. Patient taken to OR on 7/14 for emergent surgery emergently s/p exploratory laparotomy with small bowel resection and feeding jejunostomy creation. 2ry to perforated intestine. Patient with prolonged post-op ileus and NPO for 4 days. Pharmacy consulted to start and manage TPN.  Glucose / Insulin : Diabetic on SSI and A1C 7.2%. BG 108. 16 units of insulin .  Electrolytes:  Mg = 1.8 > 1.6 K=3.2 > 3 > 3.7;   Renal: Scr 0.63 > 0.55 > 0.58 Hepatic: LFTs - WNL Intake / Output; MIVF: D5Ns with @ 75 ml/hr GI Imaging: CT performed 7/17  Increased fluid-filled dilation of small-bowel loops, likely ileus. Bilateral pleural effusions, unchanged CT negative for anastomosis leak GI Surgeries / Procedures:   Central access: PICC line 7/18 TPN start date: 7/18  Nutritional Goals: Goal TPN rate is 90 mL/hr (provides 112.3 g of protein and 2198 kcals per day)  RD Assessment: Estimated Needs Total Energy Estimated Needs: 2000-2300kcal/day Total Protein Estimated Needs: 100-115g/day Total Fluid Estimated Needs: 2.0-2.3L/day  Current Nutrition: NPO  Plan:  Increase TPN to 90 mL/hr at 1800.  Electrolytes in TPN: Na 69mEq/L, K 64mEq/L, Ca 1mEq/L, Mg 67mEq/L, and Phos 15mmol/L. Cl:Ac 1:1 Mg 2g IV x 1  Add standard MVI and trace elements to TPN Add Zinc  5mg  to IV bag per dietitian recommendation Daily thiamine  outside bag already ordered by provider for alcohol withdrawal.  Change SSI to  Moderate q4h SSI and adjust as needed  Monitor TPN labs on Mon/Thurs, and daily until stable.  Cathaleen Blanch, PharmD, BCPS 02/12/2024 10:10 AM

## 2024-02-12 NOTE — Progress Notes (Signed)
 [facility]  - [place] at [facility]   PATIENT NAME: Christian Mathis    MR#:  [id number]  DATE OF BIRTH:  06-26-1976  SUBJECTIVE:  no family at bedside.NG clamped today TPN going well.    VITALS:  Blood pressure (!) 132/96, pulse 84, temperature 98.4 F (36.9 C), temperature source Oral, resp. rate 16, height 5' 9 (1.753 m), weight 58.4 kg, SpO2 100%.  PHYSICAL EXAMINATION:   GENERAL:  48 y.o.-year-old patient with no acute distress. NG+ LUNGS: Normal breath sounds bilaterally, no wheezing CARDIOVASCULAR: S1, S2 normal. No murmur   ABDOMEN: abdominal wound VAC and two drains present.  EXTREMITIES: No  edema b/l.   PICC + NEUROLOGIC: nonfocal  patient is alert and awake   LABORATORY PANEL:  CBC Recent Labs  Lab 02/11/24 0538  WBC 8.8  HGB 12.2*  HCT 35.3*  PLT 216    Chemistries  Recent Labs  Lab 02/06/24 0542 02/07/24 0427 02/12/24 0535  NA 139   < > 132*  K 4.0   < > 3.7  CL 104   < > 99  CO2 21*   < > 27  GLUCOSE 155*   < > 206*  BUN 8   < > 9  CREATININE 0.69   < > 0.58*  CALCIUM 8.9   < > 8.6*  MG  --    < > 1.6*  AST 23  --   --   ALT 34  --   --   ALKPHOS 96  --   --   BILITOT 0.8  --   --    < > = values in this interval not displayed.   Cardiac Enzymes No results for input(s): TROPONINI in the last 168 hours. RADIOLOGY:  DG Abd 2 Views Result Date: 02/11/2024 CLINICAL DATA:  [id number] Ileus following gastrointestinal surgery [facility]) [id number] EXAM: ABDOMEN - 2 VIEW COMPARISON:  02/09/2024 FINDINGS: Visualized lung bases clear. Gastric tube has its proximal side hole in the distal esophagus. No free air. Stomach and small bowel appear decompressed. Residual contrast in the descending colon without dilatation. Surgical drain in the upper abdomen. Nonspecific multi sidehole catheter left mid abdomen. Midline skin staples. No abnormal abdominal calcifications. Regional bones unremarkable. IMPRESSION: 1. Nonobstructive bowel gas pattern. 2.  Gastric tube with proximal side hole in the distal esophagus. Electronically Signed   By: JONETTA Faes M.D.   On: 02/11/2024 13:44   Assessment and Plan Christian Mathis is a 48 y.o. male with medical history significant of IIDM, diabetic neuropathy, status post Roux-en-Y gastric bypass, presented with severe sudden onset of abdominal pain.  Patient woke up this morning about 2 AM with severe sharp like abdominal pain 10/10,  all over belly, constant.   CTA chest abdomen pelvis showed signs of bowel perforation and free air in peritoneal cavity.   General surgery was consulted and patient was taken to the OR emergently s/p exploratory laparotomy with small bowel resection and feeding jejunostomy creation.  7/19-- patient with ongoing NG tube section due to postop ileus. Tolerating TPN. Continue IV antibiotic. 7/20--NG clamped today per surgery. TPN +  Bowel perforation (HCC) --Patient met severe sepsis criteria with tachycardia, leukocytosis and lactic acidosis, secondary to acute peritonitis from bowel rupture. --S/p exploratory laparotomy and small bowel resection with jejunostomy feeding feeding tube placement.  Preliminary blood cultures negative --General surgery is on board and managing --Upper GI series and CT done yesterday was negative for any anastomosis leak -- Continue with  Zosyn  -Follow recommendations by general surgery--now on TPN   Postoperative ileus (HCC) --Patient developed worsening abdominal pain with vomiting.  Tube feed was held and NG tube replacement was ordered. --No flatus or BM-concerning for developing postoperative ileus -Patient to remain n.p.o. -Supportive care,   Diabetes mellitus without complication (HCC) --CBG currently within goal, A1c of 7.2. Keep holding home metformin  -Continue with SSI   Neuropathy --No acute concern. - Will resume home gabapentin  once able to take p.o. or by jejunostomy feeding tube   Alcohol abuse - no s/o WD   COPD  (chronic obstructive pulmonary disease) (HCC) No acute concern. - As needed bronchodilator -- patient advised not to leave the room to go outside the hospital to smoke. He voiced understanding.  Procedures: exploratory laparotomy with small bowel resection and feeding jejunostomy creation Family communication : none Consults : general surgery Dr. Rodolph CODE STATUS: full DVT Prophylaxis : Lovenox  Level of care: Telemetry Medical Status is: Inpatient Remains inpatient appropriate because: bowel perforation. Currently on TPN    TOTAL TIME TAKING CARE OF THIS PATIENT: 45 minutes.  >50% time spent on counselling and coordination of care  Note: This dictation was prepared with Dragon dictation along with smaller phrase technology. Any transcriptional errors that result from this process are unintentional.  Leita Blanch M.D    [facility]   CC: Primary care physician; Practice, [facility]

## 2024-02-13 DIAGNOSIS — K631 Perforation of intestine (nontraumatic): Secondary | ICD-10-CM | POA: Diagnosis not present

## 2024-02-13 LAB — MAGNESIUM: Magnesium: 1.9 mg/dL (ref 1.7–2.4)

## 2024-02-13 LAB — GLUCOSE, CAPILLARY
Glucose-Capillary: 144 mg/dL — ABNORMAL HIGH (ref 70–99)
Glucose-Capillary: 189 mg/dL — ABNORMAL HIGH (ref 70–99)
Glucose-Capillary: 190 mg/dL — ABNORMAL HIGH (ref 70–99)
Glucose-Capillary: 198 mg/dL — ABNORMAL HIGH (ref 70–99)
Glucose-Capillary: 237 mg/dL — ABNORMAL HIGH (ref 70–99)

## 2024-02-13 LAB — COMPREHENSIVE METABOLIC PANEL WITH GFR
ALT: 14 U/L (ref 0–44)
AST: 14 U/L — ABNORMAL LOW (ref 15–41)
Albumin: 2.7 g/dL — ABNORMAL LOW (ref 3.5–5.0)
Alkaline Phosphatase: 56 U/L (ref 38–126)
Anion gap: 10 (ref 5–15)
BUN: 13 mg/dL (ref 6–20)
CO2: 25 mmol/L (ref 22–32)
Calcium: 8.6 mg/dL — ABNORMAL LOW (ref 8.9–10.3)
Chloride: 97 mmol/L — ABNORMAL LOW (ref 98–111)
Creatinine, Ser: 0.52 mg/dL — ABNORMAL LOW (ref 0.61–1.24)
GFR, Estimated: >60 mL/min (ref >60)
Glucose, Bld: 194 mg/dL — ABNORMAL HIGH (ref 70–99)
Potassium: 4.3 mmol/L (ref 3.5–5.1)
Sodium: 132 mmol/L — ABNORMAL LOW (ref 135–145)
Total Bilirubin: 0.5 mg/dL (ref 0.0–1.2)
Total Protein: 5.7 g/dL — ABNORMAL LOW (ref 6.5–8.1)

## 2024-02-13 LAB — PHOSPHORUS: Phosphorus: 3.5 mg/dL (ref 2.5–4.6)

## 2024-02-13 LAB — TRIGLYCERIDES: Triglycerides: 125 mg/dL (ref <150)

## 2024-02-13 MED ORDER — VITAL 1.5 CAL PO LIQD
1000.0000 mL | ORAL | Status: DC
  Administered 2024-02-13 – 2024-02-15 (×3): 1000 mL

## 2024-02-13 MED ORDER — ZINC CHLORIDE 1 MG/ML IV SOLN
INTRAVENOUS | Status: AC
  Filled 2024-02-13: qty 1123.2

## 2024-02-13 MED ORDER — FREE WATER
30.0000 mL | Status: DC
  Administered 2024-02-13 – 2024-02-17 (×21): 30 mL

## 2024-02-13 NOTE — Progress Notes (Signed)
 [facility]  - [place] at [facility]   PATIENT NAME: Christian Mathis    MR#:  [id number]  DATE OF BIRTH:  [birth year]  SUBJECTIVE:  no family at bedside.NG clamped today TPN going well.  VITALS:  Blood pressure 129/83, pulse 81, temperature 98.1 F (36.7 C), temperature source Oral, resp. rate 16, height 5' 9 (1.753 m), weight 57.4 kg, SpO2 100%.  PHYSICAL EXAMINATION:   GENERAL:  48 y.o.-year-old patient with no acute distress. NG+ LUNGS: Normal breath sounds bilaterally, no wheezing CARDIOVASCULAR: S1, S2 normal. No murmur   ABDOMEN: abdominal wound VAC and two drains present.  EXTREMITIES: No  edema b/l.   PICC + NEUROLOGIC: nonfocal  patient is alert and awake   LABORATORY PANEL:  CBC Recent Labs  Lab 02/11/24 0538  WBC 8.8  HGB 12.2*  HCT 35.3*  PLT 216    Chemistries  Recent Labs  Lab 02/13/24 0356  NA 132*  K 4.3  CL 97*  CO2 25  GLUCOSE 194*  BUN 13  CREATININE 0.52*  CALCIUM 8.6*  MG 1.9  AST 14*  ALT 14  ALKPHOS 56  BILITOT 0.5    Assessment and Plan Christian Mathis is a 48 y.o. male with medical history significant of IIDM, diabetic neuropathy, status post Roux-en-Y gastric bypass, presented with severe sudden onset of abdominal pain.  Patient woke up this morning about 2 AM with severe sharp like abdominal pain 10/10,  all over belly, constant.   CTA chest abdomen pelvis showed signs of bowel perforation and free air in peritoneal cavity.   General surgery was consulted and patient was taken to the OR emergently s/p exploratory laparotomy with small bowel resection and feeding jejunostomy creation.  7/19-- patient with ongoing NG tube section due to postop ileus. Tolerating TPN. Continue IV antibiotic. 7/20--NG clamped today per surgery. TPN + 7/21--no new issues today. Will cont to follow surg recs.  Bowel perforation (HCC) --Patient met severe sepsis criteria with tachycardia, leukocytosis and lactic acidosis, secondary to  acute peritonitis from bowel rupture. --S/p exploratory laparotomy and small bowel resection with jejunostomy feeding feeding tube placement.  Preliminary blood cultures negative --General surgery is on board and managing --Upper GI series and CT done yesterday was negative for any anastomosis leak -- Continue with Zosyn  -Follow recommendations by general surgery--now on TPN   Postoperative ileus (HCC) --Patient developed worsening abdominal pain with vomiting.  Tube feed was held and NG tube replacement was ordered. --No flatus or BM-concerning for developing postoperative ileus -Patient to remain n.p.o. -Supportive care,   Diabetes mellitus without complication (HCC) --CBG currently within goal, A1c of 7.2. --Keep holding home metformin  --Continue with SSI   Neuropathy --No acute concern. -- Will resume home gabapentin  once able to take p.o. or by jejunostomy feeding tube   Alcohol abuse - no s/o WD   COPD (chronic obstructive pulmonary disease) (HCC) --No acute concern. - As needed bronchodilator -- patient advised not to leave the room to go outside the hospital to smoke. He voiced understanding.  Procedures: exploratory laparotomy with small bowel resection and feeding jejunostomy creation Family communication : none Consults : general surgery Dr. Rodolph CODE STATUS: full DVT Prophylaxis : Lovenox  Level of care: Telemetry Medical Status is: Inpatient Remains inpatient appropriate because: bowel perforation. Currently on TPN    TOTAL TIME TAKING CARE OF THIS PATIENT: 45 minutes.  >50% time spent on counselling and coordination of care  Note: This dictation was prepared with Dragon dictation along  with smaller phrase technology. Any transcriptional errors that result from this process are unintentional.  Leita Blanch M.D    [facility]   CC: Primary care physician; Practice, [facility]

## 2024-02-13 NOTE — Plan of Care (Signed)
  Problem: Education: Goal: Ability to describe self-care measures that may prevent or decrease complications (Diabetes Survival Skills Education) will improve Outcome: Progressing   Problem: Coping: Goal: Ability to adjust to condition or change in health will improve Outcome: Progressing   Problem: Fluid Volume: Goal: Ability to maintain a balanced intake and output will improve Outcome: Progressing   Problem: Nutritional: Goal: Maintenance of adequate nutrition will improve Outcome: Progressing   Problem: Health Behavior/Discharge Planning: Goal: Ability to manage health-related needs will improve Outcome: Progressing   Problem: Clinical Measurements: Goal: Respiratory complications will improve Outcome: Progressing Goal: Cardiovascular complication will be avoided Outcome: Progressing   Problem: Activity: Goal: Risk for activity intolerance will decrease Outcome: Progressing   Problem: Nutrition: Goal: Adequate nutrition will be maintained Outcome: Progressing   Problem: Coping: Goal: Level of anxiety will decrease Outcome: Progressing   Problem: Metabolic: Goal: Ability to maintain appropriate glucose levels will improve Outcome: Not Progressing

## 2024-02-13 NOTE — Plan of Care (Signed)
   Problem: Fluid Volume: Goal: Ability to maintain a balanced intake and output will improve Outcome: Progressing   Problem: Health Behavior/Discharge Planning: Goal: Ability to identify and utilize available resources and services will improve Outcome: Progressing Goal: Ability to manage health-related needs will improve Outcome: Progressing   Problem: Metabolic: Goal: Ability to maintain appropriate glucose levels will improve Outcome: Progressing   Problem: Nutritional: Goal: Maintenance of adequate nutrition will improve Outcome: Progressing

## 2024-02-13 NOTE — Progress Notes (Addendum)
 [facility]- General Surgery  SURGICAL PROGRESS NOTE  Hospital Day(s): 7.   Post op day(s): 7 Days Post-Op.   Interval History:  No acute events or new complaints overnight. No sign of bowel function return despite of abdominal x-ray from 07/19, post-op day 5 showing contrast in descending colon. States he hears gurgling.  NGT clamped. States it was put back to suction while he was filling bloated. Pain well controlled. Denies any nausea or vomiting.    Vital signs in last 24 hours: [min-max] current  Temp:  [97.5 F (36.4 C)-98.6 F (37 C)] 98.1 F (36.7 C) (07/21 0737) Pulse Rate:  [71-92] 81 (07/21 0737) Resp:  [16-18] 16 (07/21 0737) BP: (129-145)/(80-97) 129/83 (07/21 0737) SpO2:  [99 %-100 %] 100 % (07/21 0737) Weight:  [57.4 kg] 57.4 kg (07/21 0422)     Height: 5' 9 (175.3 cm) Weight: 57.4 kg BMI (Calculated): 18.68   Intake/Output last 2 shifts:  07/20 0701 - 07/21 0700 In: -  Out: 600 [Urine:600]   Physical Exam:  Constitutional: alert, cooperative and no distress  Respiratory: breathing non-labored at rest  Cardiovascular: regular rate and sinus rhythm  Gastrointestinal: soft, non-tender, and non-distended, removed wound vac. Skin staples intact. Minimal serous output in JP drain   Labs:     Latest Ref Rng & Units 02/11/2024    5:38 AM 02/07/2024    4:27 AM 02/06/2024    9:37 PM  CBC  WBC 4.0 - 10.5 K/uL 8.8  12.7  14.4   Hemoglobin 13.0 - 17.0 g/dL 87.7  86.5  86.8   Hematocrit 39.0 - 52.0 % 35.3  38.4  37.3   Platelets 150 - 400 K/uL 216  218  212       Latest Ref Rng & Units 02/13/2024    3:56 AM 02/12/2024    5:35 AM 02/11/2024    5:38 AM  CMP  Glucose 70 - 99 mg/dL 805  793  865   BUN 6 - 20 mg/dL 13  9  7    Creatinine 0.61 - 1.24 mg/dL 9.47  9.41  9.44   Sodium 135 - 145 mmol/L 132  132  137   Potassium 3.5 - 5.1 mmol/L 4.3  3.7  3.0   Chloride 98 - 111 mmol/L 97  99  100   CO2 22 - 32 mmol/L 25  27  31    Calcium 8.9 - 10.3 mg/dL 8.6  8.6  8.3    Total Protein 6.5 - 8.1 g/dL 5.7     Total Bilirubin 0.0 - 1.2 mg/dL 0.5     Alkaline Phos 38 - 126 U/L 56     AST 15 - 41 U/L 14     ALT 0 - 44 U/L 14       Imaging studies: No new pertinent imaging studies   Assessment/Plan:  48 y.o. male with  perforation of intestine  7 Days Post-Op s/p  for exploratory laparotomy with small bowel resection and feeding jejunostomy creation, complicated by pertinent comorbidities including alcohol use disorder, diabetes, and neuropathy.    - Stable vital signs    - Post-op ileus day 5, continue NPO and NGT clamped    - Continue MiraLAX  via NGT    - Re-start feeding supplement through J-tube at a slow rate per dietician recommendations  - Continue TPN - Continue to monitor JP output  - Continue IV zosyn   - Continue pain management and DVT prophylaxis  - Encouraged patient to ambulate   --  Lakena Sparlin Barrientos PA-C

## 2024-02-13 NOTE — Consult Note (Signed)
 PHARMACY - TOTAL PARENTERAL NUTRITION CONSULT NOTE   Indication: Prolonged ileus  Patient Measurements: Height: 5' 9 (175.3 cm) Weight: 57.4 kg (126 lb 8.7 oz) IBW/kg (Calculated) : 70.7 TPN AdjBW (KG): 65.8  Assessment:  IIDM, diabetic neuropathy, status post Roux-en-Y gastric bypass, and alcohol use disorder. Patient taken to OR on 7/14 for emergent surgery emergently s/p exploratory laparotomy with small bowel resection and feeding jejunostomy creation. 2ry to perforated intestine. Patient with prolonged post-op ileus and NPO for 4 days. Pharmacy consulted to start and manage TPN.  Glucose / Insulin : Diabetic on SSI and A1C 7.2%. BG 108-194. 13 units of insulin  in last 24 hours.  Electrolytes:  Mg = 1.8 > 1.6>1.9 K=3.2 > 3 > 3.7;   Renal: Scr < 1 Hepatic: LFTs - WNL Intake / Output; MIVF: D5Ns with @ 75 ml/hr GI Imaging: CT performed 7/17  Increased fluid-filled dilation of small-bowel loops, likely ileus. Bilateral pleural effusions, unchanged CT negative for anastomosis leak GI Surgeries / Procedures:   Central access: PICC line 7/18 TPN start date: 7/18  Nutritional Goals: Goal TPN rate is 90 mL/hr (provides 112.3 g of protein and 2198 kcals per day)  RD Assessment: Estimated Needs Total Energy Estimated Needs: 2000-2300kcal/day Total Protein Estimated Needs: 100-115g/day Total Fluid Estimated Needs: 2.0-2.3L/day  Current Nutrition: NPO  Plan:  Continue TPN to 90 mL/hr at 1800.  Electrolytes in TPN: Na 33mEq/L, K 53mEq/L, Ca 5mEq/L, Mg 90mEq/L, and Phos 15mmol/L. Cl:Ac 1:1 Add standard MVI and trace elements to TPN Add Zinc  5mg  to IV bag per dietitian recommendation Daily thiamine  outside bag already ordered by provider for alcohol withdrawal.  Change SSI to  Moderate q4h SSI and adjust as needed  Monitor TPN labs on Mon/Thurs, and daily until stable.  Kayla Mose Blew III, PharmD, BCPS 02/13/2024 9:35 AM

## 2024-02-13 NOTE — Progress Notes (Signed)
 Nutrition Follow Up Note   DOCUMENTATION CODES:   Severe malnutrition in context of chronic illness  INTERVENTION:   Continue TPN per pharmacy- provides 2198kcal/day and 112g/day protein   Zinc  5mg  daily added in TPN  Pt remains at high refeed risk; recommend monitor potassium, magnesium  and phosphorus labs daily until stable  Continue MVI, folic acid  and thiamine  daily in TPN  Vital 1.5@65ml /hr- Initiate at 5ml/hr, once ok per surgery, increase by 10ml/hr q8 hours until goal rate is reached.   Free water  flushes 30ml q4 hours to maintain tube patency   Regimen provides 2340kcal/day, 105g/day protein and 1336ml/day of free water .   Ergocalciferol  50,000 units po weekly x 6 weeks with diet advancement   Vitamin A  10,000 units po daily x 30 days with diet advancement   Vitamin E  400 units po daily x 30 days with diet advancement   Zinc  50mg  po daily x 30 days once TPN discontinued   NUTRITION DIAGNOSIS:   Severe Malnutrition related to chronic illness (roux-en-y, COPD, etoh abuse) as evidenced by severe fat depletion, severe muscle depletion. -ongoing   GOAL:   Patient will meet greater than or equal to 90% of their needs -met   MONITOR:   Diet advancement, Labs, Weight trends, Skin, I & O's, Other (Comment) (TPN)  ASSESSMENT:   48 y/o male with h/o DM, etoh abuse, GERD, MDD, morbid obesity s/p rouex-en-y gastric bypass (2014), COPD, OSA, neuropathy and chronic pancreatitis who is admitted with small bowel perforation now s/p exploratory laparotomy with small bowel resection & feeding jejunostomy creation 7/14 complicated by post op ileus.  Pt tolerating TPN well at goal rate. Refeed labs stable. Will initiate trickle feeds via J-tube per surgery. No flatus or bowel movement yet. NGT clamped. Pt with numerous vitamin deficiencies; will provide supplementation as appropriate. Per chart, pt is down ~8lbs since admission. Pt -11.4L on his I & Os. UOP decreased.    Medications reviewed and include: lovenox , folic acid , insulin , nicotine , protonix , miralax , thiamine , zosyn , TPN  Labs reviewed: Na 132(L), K 4.3 wnl, creat 0.52(L), P 3.5 wnl, Mg 1.9 wnl Triglycerides- 125 Folate 6.5 wnl, copper  79 wnl, B1 203.5(H), vitamin D  10.85(L), vitamin A  12.3(L), B12 400 wnl, vitamin E  6.7(L), zinc  35(L)- 7/16 Cbgs- 198. 190, 189, 144 x 24 hrs   Drains- 55ml   UOP-   Diet Order:    Diet Order             Diet NPO time specified  Diet effective now                  EDUCATION NEEDS:   Education needs have been addressed  Skin:  Skin Assessment: Reviewed RN Assessment (incision abdomen)  Last BM:  7/14  Height:   Ht Readings from Last 1 Encounters:  02/06/24 5' 9 (1.753 m)    Weight:   Wt Readings from Last 1 Encounters:  02/13/24 57.4 kg    Ideal Body Weight:  72.7 kg  BMI:  Body mass index is 18.69 kg/m.  Estimated Nutritional Needs:   Kcal:  2000-2300kcal/day  Protein:  100-115g/day  Fluid:  2.0-2.3L/day  Augustin Shams MS, RD, LDN If unable to be reached, please send secure chat to RD inpatient available from 8:00a-4:00p daily

## 2024-02-14 DIAGNOSIS — K631 Perforation of intestine (nontraumatic): Secondary | ICD-10-CM | POA: Diagnosis not present

## 2024-02-14 LAB — GLUCOSE, CAPILLARY
Glucose-Capillary: 139 mg/dL — ABNORMAL HIGH (ref 70–99)
Glucose-Capillary: 172 mg/dL — ABNORMAL HIGH (ref 70–99)
Glucose-Capillary: 172 mg/dL — ABNORMAL HIGH (ref 70–99)
Glucose-Capillary: 179 mg/dL — ABNORMAL HIGH (ref 70–99)
Glucose-Capillary: 195 mg/dL — ABNORMAL HIGH (ref 70–99)
Glucose-Capillary: 230 mg/dL — ABNORMAL HIGH (ref 70–99)
Glucose-Capillary: 239 mg/dL — ABNORMAL HIGH (ref 70–99)

## 2024-02-14 LAB — CBC
HCT: 38.7 % — ABNORMAL LOW (ref 39.0–52.0)
Hemoglobin: 13.4 g/dL (ref 13.0–17.0)
MCH: 30.2 pg (ref 26.0–34.0)
MCHC: 34.6 g/dL (ref 30.0–36.0)
MCV: 87.2 fL (ref 80.0–100.0)
Platelets: 319 K/uL (ref 150–400)
RBC: 4.44 MIL/uL (ref 4.22–5.81)
RDW: 13.2 % (ref 11.5–15.5)
WBC: 10.4 K/uL (ref 4.0–10.5)
nRBC: 0 % (ref 0.0–0.2)

## 2024-02-14 LAB — MAGNESIUM: Magnesium: 1.9 mg/dL (ref 1.7–2.4)

## 2024-02-14 MED ORDER — ZINC CHLORIDE 1 MG/ML IV SOLN
INTRAVENOUS | Status: AC
  Filled 2024-02-14: qty 1123.2

## 2024-02-14 NOTE — Consult Note (Signed)
 PHARMACY - TOTAL PARENTERAL NUTRITION CONSULT NOTE   Indication: Prolonged ileus  Patient Measurements: Height: 5' 9 (175.3 cm) Weight: 57.4 kg (126 lb 8.7 oz) IBW/kg (Calculated) : 70.7 TPN AdjBW (KG): 65.8  Assessment:  IIDM, diabetic neuropathy, status post Roux-en-Y gastric bypass, and alcohol use disorder. Patient taken to OR on 7/14 for emergent surgery emergently s/p exploratory laparotomy with small bowel resection and feeding jejunostomy creation. 2ry to perforated intestine. Patient with prolonged post-op ileus and NPO for 4 days. Pharmacy consulted to start and manage TPN.  Glucose / Insulin : Diabetic on SSI and A1C 7.2%. BG 172-237. 20 units of insulin  in last 24 hours.  Reduced dextrose  from 15% to 13% 7/22 Electrolytes:  Mg = 1.8 > 1.6>1.9 K=3.2 > 3 > 3.7;   Renal: Scr < 1 Hepatic: LFTs - WNL Intake / Output; MIVF: D5Ns with @ 75 ml/hr GI Imaging: CT performed 7/17  Increased fluid-filled dilation of small-bowel loops, likely ileus. Bilateral pleural effusions, unchanged CT negative for anastomosis leak GI Surgeries / Procedures:   Central access: PICC line 7/18 TPN start date: 7/18  Nutritional Goals: Goal TPN rate is 90 mL/hr (provides 112.3 g of protein and 2052 kcals per day)  RD Assessment: Estimated Needs Total Energy Estimated Needs: 2000-2300kcal/day Total Protein Estimated Needs: 100-115g/day Total Fluid Estimated Needs: 2.0-2.3L/day  Current Nutrition: CLD  Plan:  Continue TPN to 90 mL/hr at 1800.  Electrolytes in TPN: Na 54mEq/L, K 29mEq/L, Ca 76mEq/L, Mg 38mEq/L, and Phos 15mmol/L. Cl:Ac 1:1 Add standard MVI and trace elements to TPN Add Zinc  5mg  to IV bag per dietitian recommendation Daily thiamine  outside bag already ordered by provider for alcohol withdrawal.  Change SSI to  Moderate q4h SSI and adjust as needed  Monitor TPN labs on Mon/Thurs, and daily until stable.  Kayla Mose Blew III, PharmD, BCPS 02/14/2024 7:58  AM

## 2024-02-14 NOTE — Progress Notes (Signed)
 [facility]  - [place] at [facility]   PATIENT NAME: Christian Mathis    MR#:  [id number]  DATE OF BIRTH:  April 21, 1976  SUBJECTIVE:  no family at bedside.NG clamped today TPN going well.  VITALS:  Blood pressure 120/79, pulse 96, temperature 97.7 F (36.5 C), temperature source Oral, resp. rate 17, height 5' 9 (1.753 m), weight 57.4 kg, SpO2 100%.  PHYSICAL EXAMINATION:   GENERAL:  48 y.o.-year-old patient with no acute distress. NG+ LUNGS: Normal breath sounds bilaterally, no wheezing CARDIOVASCULAR: S1, S2 normal. No murmur   ABDOMEN: abdominal wound VAC and two drains present.  EXTREMITIES: No  edema b/l.   PICC + NEUROLOGIC: nonfocal  patient is alert and awake   LABORATORY PANEL:  CBC Recent Labs  Lab 02/14/24 0510  WBC 10.4  HGB 13.4  HCT 38.7*  PLT 319    Chemistries  Recent Labs  Lab 02/13/24 0356 02/14/24 0510  NA 132*  --   K 4.3  --   CL 97*  --   CO2 25  --   GLUCOSE 194*  --   BUN 13  --   CREATININE 0.52*  --   CALCIUM 8.6*  --   MG 1.9 1.9  AST 14*  --   ALT 14  --   ALKPHOS 56  --   BILITOT 0.5  --     Assessment and Plan Christian Mathis is a 48 y.o. male with medical history significant of IIDM, diabetic neuropathy, status post Roux-en-Y gastric bypass, presented with severe sudden onset of abdominal pain.  Patient woke up this morning about 2 AM with severe sharp like abdominal pain 10/10,  all over belly, constant.   CTA chest abdomen pelvis showed signs of bowel perforation and free air in peritoneal cavity.   General surgery was consulted and patient was taken to the OR emergently s/p exploratory laparotomy with small bowel resection and feeding jejunostomy creation.  7/19-- patient with ongoing NG tube section due to postop ileus. Tolerating TPN. Continue IV antibiotic. 7/20--NG clamped today per surgery. TPN + 7/21--no new issues today. Will cont to follow surg recs.  Bowel perforation (HCC) --Patient met severe  sepsis criteria with tachycardia, leukocytosis and lactic acidosis, secondary to acute peritonitis from bowel rupture. --S/p exploratory laparotomy and small bowel resection with jejunostomy feeding feeding tube placement.  Preliminary blood cultures negative --General surgery is on board and managing --Upper GI series and CT done yesterday was negative for any anastomosis leak -- Continue with Zosyn  -Follow recommendations by general surgery--now on TPN   Postoperative ileus (HCC) --Patient developed worsening abdominal pain with vomiting.  Tube feed was held and NG tube replacement was ordered. --No flatus or BM-concerning for developing postoperative ileus -Patient to remain n.p.o. -Supportive care,   Diabetes mellitus without complication (HCC) --CBG currently within goal, A1c of 7.2. --Keep holding home metformin  --Continue with SSI   Neuropathy --No acute concern. -- Will resume home gabapentin  once able to take p.o. or by jejunostomy feeding tube   Alcohol abuse - no s/o WD   COPD (chronic obstructive pulmonary disease) (HCC) --No acute concern. - As needed bronchodilator -- patient advised not to leave the room to go outside the hospital to smoke. He voiced understanding.  Procedures: exploratory laparotomy with small bowel resection and feeding jejunostomy creation Family communication : none Consults : general surgery Dr. Rodolph CODE STATUS: full DVT Prophylaxis : Lovenox  Level of care: Telemetry Medical Status is: Inpatient Remains inpatient  appropriate because: bowel perforation. Currently on TPN    TOTAL TIME TAKING CARE OF THIS PATIENT: 35 minutes.  >50% time spent on counselling and coordination of care  Note: This dictation was prepared with Dragon dictation along with smaller phrase technology. Any transcriptional errors that result from this process are unintentional.  Christian Mathis M.D    [facility]   CC: Primary care physician;  Practice, [facility]

## 2024-02-14 NOTE — Progress Notes (Signed)
 [facility]- General Surgery  SURGICAL PROGRESS NOTE  Hospital Day(s): 8.   Post op day(s): 8 Days Post-Op.   Interval History:  Patient seen and examined. No acute events or new complaints overnight. States he was feeling nauseous yesterday  and put NGT back to suction for a couple hours. Denies any nausea or vomiting after that episode. Has had NGT clamped since. Reports passing gas last night and this morning. No bowel movement.    Vital signs in last 24 hours: [min-max] current  Temp:  [97.7 F (36.5 C)-98.8 F (37.1 C)] 97.7 F (36.5 C) (07/22 0416) Pulse Rate:  [89-96] 96 (07/22 0416) Resp:  [16-17] 17 (07/22 0416) BP: (120-136)/(79-94) 120/79 (07/22 0416) SpO2:  [100 %] 100 % (07/22 0416)     Height: 5' 9 (175.3 cm) Weight: 57.4 kg BMI (Calculated): 18.68   Intake/Output last 2 shifts:  07/21 0701 - 07/22 0700 In: 90 [NG/GT:90] Out: 1050 [Urine:950; Drains:100]   Physical Exam:  Constitutional: alert, cooperative and no distress  Respiratory: breathing non-labored at rest  Cardiovascular: regular rate and sinus rhythm  Gastrointestinal: soft, non-tender, and non-distended, staples intact, minimal serous output from JP drain and J-tube in place   Labs:     Latest Ref Rng & Units 02/14/2024    5:10 AM 02/11/2024    5:38 AM 02/07/2024    4:27 AM  CBC  WBC 4.0 - 10.5 K/uL 10.4  8.8  12.7   Hemoglobin 13.0 - 17.0 g/dL 86.5  87.7  86.5   Hematocrit 39.0 - 52.0 % 38.7  35.3  38.4   Platelets 150 - 400 K/uL 319  216  218       Latest Ref Rng & Units 02/13/2024    3:56 AM 02/12/2024    5:35 AM 02/11/2024    5:38 AM  CMP  Glucose 70 - 99 mg/dL 805  793  865   BUN 6 - 20 mg/dL 13  9  7    Creatinine 0.61 - 1.24 mg/dL 9.47  9.41  9.44   Sodium 135 - 145 mmol/L 132  132  137   Potassium 3.5 - 5.1 mmol/L 4.3  3.7  3.0   Chloride 98 - 111 mmol/L 97  99  100   CO2 22 - 32 mmol/L 25  27  31    Calcium 8.9 - 10.3 mg/dL 8.6  8.6  8.3   Total Protein 6.5 - 8.1 g/dL 5.7      Total Bilirubin 0.0 - 1.2 mg/dL 0.5     Alkaline Phos 38 - 126 U/L 56     AST 15 - 41 U/L 14     ALT 0 - 44 U/L 14       Imaging studies: No new pertinent imaging studies   Assessment/Plan:  48 y.o. male with perforation of intestine  8 Days Post-Op s/p for exploratory laparotomy with small bowel resection and feeding jejunostomy creation, complicated by pertinent comorbidities including alcohol use disorder, diabetes, and neuropathy.    - Post-op ileus day 6 - No fever, not tachycardic and no leukocytosis    - Start clear liquid diet in small portions    - Continue TPN   - Continue enteral feeding via J-tube at a slow rate   - Continue Miralax    - Plan to remove JP drain today  - Continue IV Zosyn    - Continue pain management and DVT prophylaxis    -- Cheyanna Strick Barrientos PA-C

## 2024-02-14 NOTE — Plan of Care (Signed)
  Problem: Education: Goal: Ability to describe self-care measures that may prevent or decrease complications (Diabetes Survival Skills Education) will improve Outcome: Progressing   Problem: Coping: Goal: Ability to adjust to condition or change in health will improve Outcome: Progressing   Problem: Fluid Volume: Goal: Ability to maintain a balanced intake and output will improve Outcome: Progressing   Problem: Health Behavior/Discharge Planning: Goal: Ability to identify and utilize available resources and services will improve Outcome: Progressing Goal: Ability to manage health-related needs will improve Outcome: Progressing   Problem: Nutritional: Goal: Maintenance of adequate nutrition will improve Outcome: Progressing   Problem: Skin Integrity: Goal: Risk for impaired skin integrity will decrease Outcome: Progressing   Problem: Education: Goal: Knowledge of General Education information will improve Description: Including pain rating scale, medication(s)/side effects and non-pharmacologic comfort measures Outcome: Progressing   Problem: Metabolic: Goal: Ability to maintain appropriate glucose levels will improve Outcome: Not Progressing   Problem: Nutritional: Goal: Progress toward achieving an optimal weight will improve Outcome: Not Progressing

## 2024-02-15 ENCOUNTER — Inpatient Hospital Stay

## 2024-02-15 DIAGNOSIS — K631 Perforation of intestine (nontraumatic): Secondary | ICD-10-CM | POA: Diagnosis not present

## 2024-02-15 LAB — GLUCOSE, CAPILLARY
Glucose-Capillary: 152 mg/dL — ABNORMAL HIGH (ref 70–99)
Glucose-Capillary: 184 mg/dL — ABNORMAL HIGH (ref 70–99)
Glucose-Capillary: 220 mg/dL — ABNORMAL HIGH (ref 70–99)
Glucose-Capillary: 274 mg/dL — ABNORMAL HIGH (ref 70–99)
Glucose-Capillary: 99 mg/dL (ref 70–99)

## 2024-02-15 LAB — BASIC METABOLIC PANEL WITH GFR
Anion gap: 6 (ref 5–15)
BUN: 18 mg/dL (ref 6–20)
CO2: 26 mmol/L (ref 22–32)
Calcium: 8.5 mg/dL — ABNORMAL LOW (ref 8.9–10.3)
Chloride: 98 mmol/L (ref 98–111)
Creatinine, Ser: 0.56 mg/dL — ABNORMAL LOW (ref 0.61–1.24)
GFR, Estimated: >60 mL/min (ref >60)
Glucose, Bld: 233 mg/dL — ABNORMAL HIGH (ref 70–99)
Potassium: 4.7 mmol/L (ref 3.5–5.1)
Sodium: 130 mmol/L — ABNORMAL LOW (ref 135–145)

## 2024-02-15 LAB — MAGNESIUM: Magnesium: 2.1 mg/dL (ref 1.7–2.4)

## 2024-02-15 LAB — PHOSPHORUS: Phosphorus: 3.2 mg/dL (ref 2.5–4.6)

## 2024-02-15 MED ORDER — HYDROMORPHONE HCL 1 MG/ML IJ SOLN: 0.5000 mg | INTRAMUSCULAR | Status: DC | PRN

## 2024-02-15 MED ORDER — ZINC CHLORIDE 1 MG/ML IV SOLN
INTRAVENOUS | Status: AC
  Filled 2024-02-15: qty 1123.2

## 2024-02-15 MED ORDER — ENSURE PLUS HIGH PROTEIN PO LIQD: 237.0000 mL | Freq: Three times a day (TID) | ORAL | Status: DC

## 2024-02-15 MED ORDER — OXYCODONE-ACETAMINOPHEN 5-325 MG PO TABS
1.0000 | ORAL_TABLET | Freq: Four times a day (QID) | ORAL | Status: DC | PRN
  Administered 2024-02-15 – 2024-02-16 (×3): 2 via ORAL
  Administered 2024-02-16 (×2): 1 via ORAL
  Administered 2024-02-17 (×2): 2 via ORAL
  Filled 2024-02-15 (×6): qty 2
  Filled 2024-02-15: qty 1

## 2024-02-15 MED ORDER — NEPRO/CARBSTEADY PO LIQD
237.0000 mL | Freq: Three times a day (TID) | ORAL | Status: DC
  Administered 2024-02-15 – 2024-02-16 (×4): 237 mL via ORAL

## 2024-02-15 MED ORDER — METFORMIN HCL 500 MG PO TABS
1000.0000 mg | ORAL_TABLET | Freq: Two times a day (BID) | ORAL | Status: DC
  Administered 2024-02-15 – 2024-02-17 (×4): 1000 mg via ORAL
  Filled 2024-02-15 (×4): qty 2

## 2024-02-15 MED ORDER — ALTEPLASE 2 MG IJ SOLR
2.0000 mg | Freq: Once | INTRAMUSCULAR | Status: DC
  Filled 2024-02-15 (×2): qty 2

## 2024-02-15 MED ORDER — HYDROMORPHONE HCL 1 MG/ML IJ SOLN
0.5000 mg | Freq: Four times a day (QID) | INTRAMUSCULAR | Status: DC | PRN
  Administered 2024-02-15 – 2024-02-17 (×5): 1 mg via INTRAVENOUS
  Filled 2024-02-15 (×5): qty 1

## 2024-02-15 NOTE — Plan of Care (Signed)
 Problem: Education: Goal: Ability to describe self-care measures that may prevent or decrease complications (Diabetes Survival Skills Education) will improve 02/15/2024 0657 by Janit Orvin HERO, RN Outcome: Progressing 02/15/2024 0656 by Janit Orvin HERO, RN Outcome: Progressing Goal: Individualized Educational Video(s) 02/15/2024 0657 by Janit Orvin HERO, RN Outcome: Progressing 02/15/2024 0656 by Janit Orvin HERO, RN Outcome: Progressing   Problem: Coping: Goal: Ability to adjust to condition or change in health will improve 02/15/2024 0657 by Janit Orvin HERO, RN Outcome: Progressing 02/15/2024 0656 by Janit Orvin HERO, RN Outcome: Progressing   Problem: Fluid Volume: Goal: Ability to maintain a balanced intake and output will improve 02/15/2024 0657 by Janit Orvin HERO, RN Outcome: Progressing 02/15/2024 0656 by Janit Orvin HERO, RN Outcome: Progressing   Problem: Health Behavior/Discharge Planning: Goal: Ability to identify and utilize available resources and services will improve 02/15/2024 0657 by Janit Orvin HERO, RN Outcome: Progressing 02/15/2024 0656 by Janit Orvin HERO, RN Outcome: Progressing Goal: Ability to manage health-related needs will improve 02/15/2024 0657 by Janit Orvin HERO, RN Outcome: Progressing 02/15/2024 0656 by Janit Orvin HERO, RN Outcome: Progressing   Problem: Metabolic: Goal: Ability to maintain appropriate glucose levels will improve 02/15/2024 0657 by Janit Orvin HERO, RN Outcome: Progressing 02/15/2024 0656 by Janit Orvin HERO, RN Outcome: Progressing   Problem: Nutritional: Goal: Maintenance of adequate nutrition will improve 02/15/2024 0657 by Janit Orvin HERO, RN Outcome: Progressing 02/15/2024 0656 by Janit Orvin HERO, RN Outcome: Progressing Goal: Progress toward achieving an optimal weight will improve 02/15/2024 0657 by Janit Orvin HERO, RN Outcome: Progressing 02/15/2024 0656 by Janit Orvin HERO, RN Outcome: Progressing   Problem: Skin Integrity: Goal: Risk for  impaired skin integrity will decrease 02/15/2024 0657 by Janit Orvin HERO, RN Outcome: Progressing 02/15/2024 0656 by Janit Orvin HERO, RN Outcome: Progressing   Problem: Tissue Perfusion: Goal: Adequacy of tissue perfusion will improve 02/15/2024 0657 by Janit Orvin HERO, RN Outcome: Progressing 02/15/2024 0656 by Janit Orvin HERO, RN Outcome: Progressing   Problem: Education: Goal: Knowledge of General Education information will improve Description: Including pain rating scale, medication(s)/side effects and non-pharmacologic comfort measures 02/15/2024 0657 by Janit Orvin HERO, RN Outcome: Progressing 02/15/2024 0656 by Janit Orvin HERO, RN Outcome: Progressing   Problem: Health Behavior/Discharge Planning: Goal: Ability to manage health-related needs will improve 02/15/2024 0657 by Janit Orvin HERO, RN Outcome: Progressing 02/15/2024 0656 by Janit Orvin HERO, RN Outcome: Progressing   Problem: Clinical Measurements: Goal: Ability to maintain clinical measurements within normal limits will improve 02/15/2024 0657 by Janit Orvin HERO, RN Outcome: Progressing 02/15/2024 0656 by Janit Orvin HERO, RN Outcome: Progressing Goal: Will remain free from infection 02/15/2024 0657 by Janit Orvin HERO, RN Outcome: Progressing 02/15/2024 0656 by Janit Orvin HERO, RN Outcome: Progressing Goal: Diagnostic test results will improve 02/15/2024 0657 by Janit Orvin HERO, RN Outcome: Progressing 02/15/2024 0656 by Janit Orvin HERO, RN Outcome: Progressing Goal: Respiratory complications will improve 02/15/2024 0657 by Janit Orvin HERO, RN Outcome: Progressing 02/15/2024 0656 by Janit Orvin HERO, RN Outcome: Progressing Goal: Cardiovascular complication will be avoided 02/15/2024 0657 by Janit Orvin HERO, RN Outcome: Progressing 02/15/2024 0656 by Janit Orvin HERO, RN Outcome: Progressing   Problem: Activity: Goal: Risk for activity intolerance will decrease 02/15/2024 0657 by Janit Orvin HERO, RN Outcome: Progressing 02/15/2024  0656 by Janit Orvin HERO, RN Outcome: Progressing   Problem: Nutrition: Goal: Adequate nutrition will be maintained 02/15/2024 0657 by Janit Orvin HERO, RN Outcome: Progressing 02/15/2024 0656 by Janit Orvin HERO, RN Outcome: Progressing  Problem: Coping: Goal: Level of anxiety will decrease 02/15/2024 0657 by Janit Orvin HERO, RN Outcome: Progressing 02/15/2024 0656 by Janit Orvin HERO, RN Outcome: Progressing   Problem: Elimination: Goal: Will not experience complications related to bowel motility 02/15/2024 0657 by Janit Orvin HERO, RN Outcome: Progressing 02/15/2024 0656 by Janit Orvin HERO, RN Outcome: Progressing Goal: Will not experience complications related to urinary retention 02/15/2024 0657 by Janit Orvin HERO, RN Outcome: Progressing 02/15/2024 0656 by Janit Orvin HERO, RN Outcome: Progressing   Problem: Pain Managment: Goal: General experience of comfort will improve and/or be controlled 02/15/2024 0657 by Janit Orvin HERO, RN Outcome: Progressing 02/15/2024 0656 by Janit Orvin HERO, RN Outcome: Progressing   Problem: Safety: Goal: Ability to remain free from injury will improve 02/15/2024 0657 by Janit Orvin HERO, RN Outcome: Progressing 02/15/2024 0656 by Janit Orvin HERO, RN Outcome: Progressing   Problem: Skin Integrity: Goal: Risk for impaired skin integrity will decrease 02/15/2024 0657 by Janit Orvin HERO, RN Outcome: Progressing 02/15/2024 0656 by Janit Orvin HERO, RN Outcome: Progressing

## 2024-02-15 NOTE — Progress Notes (Signed)
 Pt with positional PICC line and sluggish blood return.  Consult placed for IV team to assess.

## 2024-02-15 NOTE — Consult Note (Signed)
 PHARMACY - TOTAL PARENTERAL NUTRITION CONSULT NOTE   Indication: Prolonged ileus  Patient Measurements: Height: 5' 9 (175.3 cm) Weight: 58.4 kg (128 lb 12 oz) IBW/kg (Calculated) : 70.7 TPN AdjBW (KG): 65.8  Assessment:  IIDM, diabetic neuropathy, status post Roux-en-Y gastric bypass, and alcohol use disorder. Patient taken to OR on 7/14 for emergent surgery emergently s/p exploratory laparotomy with small bowel resection and feeding jejunostomy creation. 2ry to perforated intestine. Patient with prolonged post-op ileus and NPO for 4 days. Pharmacy consulted to start and manage TPN.  Glucose / Insulin : Diabetic on SSI and A1C 7.2%. BG 139-239. 23 units of insulin  in last 24 hours.  Add 10 units of insulin  to TPN starting 7/23 Reduced dextrose  from 15% to 13% 7/22 Electrolytes:  Na 130>>increase in TPN Renal: Scr < 1 Hepatic: LFTs - WNL Intake / Output; MIVF: D5Ns with @ 75 ml/hr GI Imaging: CT performed 7/17  Increased fluid-filled dilation of small-bowel loops, likely ileus. Bilateral pleural effusions, unchanged CT negative for anastomosis leak GI Surgeries / Procedures:   Central access: PICC line 7/18 TPN start date: 7/18  Nutritional Goals: Goal TPN rate is 90 mL/hr (provides 112.3 g of protein and 2052 kcals per day)  RD Assessment: Estimated Needs Total Energy Estimated Needs: 2000-2300kcal/day Total Protein Estimated Needs: 100-115g/day Total Fluid Estimated Needs: 2.0-2.3L/day  Current Nutrition: FLD  Plan:  Continue TPN to 90 mL/hr at 1800.  Electrolytes in TPN: Na 74mEq/L (Increase to 60 mEq/L on 7/23), K 50 mEq/L (Decrease to 40 mEq/L 7/23), Ca 44mEq/L, Mg 44mEq/L, and Phos 15mmol/L. Cl:Ac 1:1 Add standard MVI and trace elements to TPN Add Zinc  5 mg to IV bag per dietitian recommendation Daily thiamine  outside bag already ordered by provider for alcohol withdrawal.  Add Regular insulin  10 units to TPN bag Change SSI to  Moderate q4h SSI and  adjust as needed  Monitor TPN labs on Mon/Thurs, and daily until stable.  Kayla Mose Niels DOUGLAS, PharmD, BCPS 02/15/2024 7:34 AM

## 2024-02-15 NOTE — Plan of Care (Signed)

## 2024-02-15 NOTE — Progress Notes (Signed)
 [facility]  - [place] at [facility]   PATIENT NAME: Christian Mathis    MR#:  [id number]  DATE OF BIRTH:  Dec 03, 1975  SUBJECTIVE:  NG removed. JP drain removed. Had BM x2 now non CLD  VITALS:  Blood pressure 120/83, pulse 98, temperature 97.8 F (36.6 C), temperature source Oral, resp. rate 18, height 5' 9 (1.753 m), weight 58.4 kg, SpO2 100%.  PHYSICAL EXAMINATION:   GENERAL:  48 y.o.-year-old patient with no acute distress.  LUNGS: Normal breath sounds bilaterally, no wheezing CARDIOVASCULAR: S1, S2 normal. No murmur   ABDOMEN: PEG +. Midline incision stable EXTREMITIES: No  edema b/l.   PICC + NEUROLOGIC: nonfocal  patient is alert and awake   LABORATORY PANEL:  CBC Recent Labs  Lab 02/14/24 0510  WBC 10.4  HGB 13.4  HCT 38.7*  PLT 319    Chemistries  Recent Labs  Lab 02/13/24 0356 02/14/24 0510 02/15/24 0445  NA 132*  --  130*  K 4.3  --  4.7  CL 97*  --  98  CO2 25  --  26  GLUCOSE 194*  --  233*  BUN 13  --  18  CREATININE 0.52*  --  0.56*  CALCIUM 8.6*  --  8.5*  MG 1.9   < > 2.1  AST 14*  --   --   ALT 14  --   --   ALKPHOS 56  --   --   BILITOT 0.5  --   --    < > = values in this interval not displayed.    Assessment and Plan Christian Mathis is a 48 y.o. male with medical history significant of IIDM, diabetic neuropathy, status post Roux-en-Y gastric bypass, presented with severe sudden onset of abdominal pain.  Patient woke up this morning about 2 AM with severe sharp like abdominal pain 10/10,  all over belly, constant.   CTA chest abdomen pelvis showed signs of bowel perforation and free air in peritoneal cavity.   General surgery was consulted and patient was taken to the OR emergently s/p exploratory laparotomy with small bowel resection and feeding jejunostomy creation.   Bowel perforation (HCC) --Patient met severe sepsis criteria with tachycardia, leukocytosis and lactic acidosis, secondary to acute peritonitis from  bowel rupture. --S/p exploratory laparotomy and small bowel resection with jejunostomy feeding feeding tube placement.  Preliminary blood cultures negative --General surgery is on board and managing --Upper GI series and CT done yesterday was negative for any anastomosis leak -- Continue with Zosyn  -Follow recommendations by general surgery--now on TPN --NG removed, started CLD, had BM   Postoperative ileus (HCC) -Supportive care, Improving   Diabetes mellitus without complication (HCC) --CBG currently within goal, A1c of 7.2. --Keep holding home metformin  --Continue with SSI   Neuropathy --No acute concern. --Will resume home gabapentin  once able to take p.o. or by jejunostomy feeding tube   Alcohol abuse - no s/o WD   COPD (chronic obstructive pulmonary disease) (HCC) --No acute concern. - As needed bronchodilator  Procedures: exploratory laparotomy with small bowel resection and feeding jejunostomy creation Family communication : none Consults : general surgery Dr. Rodolph CODE STATUS: full DVT Prophylaxis : Lovenox  Level of care: Telemetry Medical Status is: Inpatient Remains inpatient appropriate because: bowel perforation. Currently on TPN, now started CLD  Discharge date per Surgery recs    TOTAL TIME TAKING CARE OF THIS PATIENT: 35 minutes.  >50% time spent on counselling and coordination of care  Note: This dictation was prepared with Dragon dictation along with smaller phrase technology. Any transcriptional errors that result from this process are unintentional.  Leita Blanch M.D    [facility]   CC: Primary care physician; Practice, [facility]

## 2024-02-15 NOTE — Progress Notes (Signed)
 IV team consulted due to TPN pump alarming during administration and difficulty flushing 2nd lumen. Upon assessment flushing and blood return appears to be dependent on position of pt arm. Chest xray showed tip properly in place but small curve in PICC under skin. PICC RN recommendation to pull PICC back to 1 cm to correct curve. PICC was adjusted to 1 cm and angled to lateral side slightly. Dressing, biopatch and stat-lock changed. After interventions PICC line was flushed without difficulty and has GBR even with arm by pt side. TPN now appears to be infusing without complication. Unit RN advised to consult if any further complications.

## 2024-02-15 NOTE — Progress Notes (Signed)
 [facility]- General Surgery  SURGICAL PROGRESS NOTE  Hospital Day(s): 9.   Post op day(s): 9 Days Post-Op.   Interval History:  Patient seen and examined. No acute events or new complaints overnight.  Patient reports feeling well overall. States he had two bowel movements yesterday. Tolerating clear liquid diet. Denies an nausea or vomiting. Removed JP drain yesterday.    Vital signs in last 24 hours: [min-max] current  Temp:  [97.8 F (36.6 C)-98.1 F (36.7 C)] 97.8 F (36.6 C) (07/23 0341) Pulse Rate:  [98] 98 (07/23 0341) Resp:  [16-18] 18 (07/23 0341) BP: (120)/(80-83) 120/83 (07/23 0341) SpO2:  [99 %-100 %] 100 % (07/23 0341) Weight:  [58.4 kg] 58.4 kg (07/23 0341)     Height: 5' 9 (175.3 cm) Weight: 58.4 kg BMI (Calculated): 19   Intake/Output last 2 shifts:  07/22 0701 - 07/23 0700 In: 1020 [P.O.:840; NG/GT:180] Out: -    Physical Exam:  Constitutional: alert, cooperative and no distress  Respiratory: breathing non-labored at rest  Cardiovascular: regular rate and sinus rhythm  Gastrointestinal: soft, non-tender, and non-distended with staples intact and J-tube in place   Labs:     Latest Ref Rng & Units 02/14/2024    5:10 AM 02/11/2024    5:38 AM 02/07/2024    4:27 AM  CBC  WBC 4.0 - 10.5 K/uL 10.4  8.8  12.7   Hemoglobin 13.0 - 17.0 g/dL 86.5  87.7  86.5   Hematocrit 39.0 - 52.0 % 38.7  35.3  38.4   Platelets 150 - 400 K/uL 319  216  218       Latest Ref Rng & Units 02/15/2024    4:45 AM 02/13/2024    3:56 AM 02/12/2024    5:35 AM  CMP  Glucose 70 - 99 mg/dL 766  805  793   BUN 6 - 20 mg/dL 18  13  9    Creatinine 0.61 - 1.24 mg/dL 9.43  9.47  9.41   Sodium 135 - 145 mmol/L 130  132  132   Potassium 3.5 - 5.1 mmol/L 4.7  4.3  3.7   Chloride 98 - 111 mmol/L 98  97  99   CO2 22 - 32 mmol/L 26  25  27    Calcium 8.9 - 10.3 mg/dL 8.5  8.6  8.6   Total Protein 6.5 - 8.1 g/dL  5.7    Total Bilirubin 0.0 - 1.2 mg/dL  0.5    Alkaline Phos 38 - 126 U/L  56     AST 15 - 41 U/L  14    ALT 0 - 44 U/L  14       Imaging studies: No new pertinent imaging studies   Assessment/Plan:  48 y.o. male with perforation of intestine  9 Days Post-Op s/p for exploratory laparotomy with small bowel resection and feeding jejunostomy creation, complicated by pertinent comorbidities including alcohol use disorder, diabetes, and neuropathy.    - Post-ileus day 7   - Stable vital signs    - GI function returning slowly, removed NG tube and advance to full liquid diet    - No change with enteral feeding via J-tube, continue at slow rate    - Continue Miralax    - Continue IV zosyn  and TPN, may discontinue both tomorrow if patient continues to clinically improve and tolerates full liquid diet   - Continue pain management and DVT prophylaxis   -- Trudie Cervantes Barrientos PA-C

## 2024-02-15 NOTE — TOC Progression Note (Signed)
 Transition of Care [facility]) - Progression Note    Patient Details  Name: Christian Mathis MRN: [id number] Date of Birth: Aug 02, 1975  [facility] CM/SW Contact  Corean ONEIDA Haddock, RN Phone Number: 02/15/2024, 2:15 PM  Clinical Narrative:     Patient currently on clear liquid diet with plans to wean TPN Tube feeds and Prevena vac discontinued   Expected Discharge Plan: Home/Self Care Barriers to Discharge: Continued Medical Work up               Expected Discharge Plan and Services     Post Acute Care Choice: NA Living arrangements for the past 2 months: Single Family Home                                       Social Drivers of Health (SDOH) Interventions SDOH Screenings   Food Insecurity: No Food Insecurity (02/06/2024)  Housing: Low Risk  (02/06/2024)  Transportation Needs: No Transportation Needs (02/06/2024)  Utilities: Not At Risk (02/06/2024)  Social Connections: Unknown (02/06/2024)  Tobacco Use: High Risk (02/06/2024)    Readmission Risk Interventions     No data to display

## 2024-02-16 DIAGNOSIS — K631 Perforation of intestine (nontraumatic): Secondary | ICD-10-CM | POA: Diagnosis not present

## 2024-02-16 LAB — COMPREHENSIVE METABOLIC PANEL WITH GFR
ALT: 14 U/L (ref 0–44)
AST: 17 U/L (ref 15–41)
Albumin: 2.5 g/dL — ABNORMAL LOW (ref 3.5–5.0)
Alkaline Phosphatase: 62 U/L (ref 38–126)
Anion gap: 7 (ref 5–15)
BUN: 17 mg/dL (ref 6–20)
CO2: 26 mmol/L (ref 22–32)
Calcium: 8.4 mg/dL — ABNORMAL LOW (ref 8.9–10.3)
Chloride: 100 mmol/L (ref 98–111)
Creatinine, Ser: 0.48 mg/dL — ABNORMAL LOW (ref 0.61–1.24)
GFR, Estimated: >60 mL/min (ref >60)
Glucose, Bld: 213 mg/dL — ABNORMAL HIGH (ref 70–99)
Potassium: 4.4 mmol/L (ref 3.5–5.1)
Sodium: 133 mmol/L — ABNORMAL LOW (ref 135–145)
Total Bilirubin: 0.4 mg/dL (ref 0.0–1.2)
Total Protein: 5.5 g/dL — ABNORMAL LOW (ref 6.5–8.1)

## 2024-02-16 LAB — GLUCOSE, CAPILLARY
Glucose-Capillary: 122 mg/dL — ABNORMAL HIGH (ref 70–99)
Glucose-Capillary: 150 mg/dL — ABNORMAL HIGH (ref 70–99)
Glucose-Capillary: 161 mg/dL — ABNORMAL HIGH (ref 70–99)
Glucose-Capillary: 169 mg/dL — ABNORMAL HIGH (ref 70–99)
Glucose-Capillary: 208 mg/dL — ABNORMAL HIGH (ref 70–99)
Glucose-Capillary: 214 mg/dL — ABNORMAL HIGH (ref 70–99)

## 2024-02-16 LAB — PHOSPHORUS: Phosphorus: 3 mg/dL (ref 2.5–4.6)

## 2024-02-16 LAB — MAGNESIUM: Magnesium: 2.2 mg/dL (ref 1.7–2.4)

## 2024-02-16 MED ORDER — VITAMIN E 45 MG (100 UNIT) PO CAPS
400.0000 [IU] | ORAL_CAPSULE | Freq: Every day | ORAL | Status: DC
  Filled 2024-02-16: qty 4

## 2024-02-16 MED ORDER — FOLIC ACID 1 MG PO TABS
1.0000 mg | ORAL_TABLET | Freq: Every day | ORAL | Status: DC
  Administered 2024-02-16 – 2024-02-17 (×2): 1 mg via ORAL
  Filled 2024-02-16 (×2): qty 1

## 2024-02-16 MED ORDER — ZINC SULFATE 220 (50 ZN) MG PO CAPS
220.0000 mg | ORAL_CAPSULE | Freq: Every day | ORAL | Status: DC
  Administered 2024-02-17: 220 mg via ORAL
  Filled 2024-02-16: qty 1

## 2024-02-16 MED ORDER — VITAMIN A 3 MG (10000 UNIT) PO CAPS
10000.0000 [IU] | ORAL_CAPSULE | Freq: Every day | ORAL | Status: DC
  Administered 2024-02-17: 10000 [IU] via ORAL
  Filled 2024-02-16: qty 1

## 2024-02-16 MED ORDER — VITAMIN D (ERGOCALCIFEROL) 1.25 MG (50000 UNIT) PO CAPS
50000.0000 [IU] | ORAL_CAPSULE | ORAL | Status: DC
  Administered 2024-02-17: 50000 [IU] via ORAL
  Filled 2024-02-16: qty 1

## 2024-02-16 MED ORDER — ZINC CHLORIDE 1 MG/ML IV SOLN
INTRAVENOUS | Status: DC
  Filled 2024-02-16: qty 561.6

## 2024-02-16 NOTE — Progress Notes (Signed)
 [facility]  -  at [facility]   PATIENT NAME: Christian Mathis    MR#:  [id number]  DATE OF BIRTH:  12-18-1975  SUBJECTIVE:  Pt advanced to soft diet Been having BM's TPN being weaned by RD  VITALS:  Blood pressure 115/71, pulse 76, temperature 98.1 F (36.7 C), resp. rate 16, height 5' 9 (1.753 m), weight 58.8 kg, SpO2 99%.  PHYSICAL EXAMINATION:   GENERAL:  48 y.o.-year-old patient with no acute distress.  LUNGS: Normal breath sounds bilaterally, no wheezing CARDIOVASCULAR: S1, S2 normal. No murmur   ABDOMEN: PEG +. Midline incision stable EXTREMITIES: No  edema b/l.   PICC + NEUROLOGIC: nonfocal  patient is alert and awake   LABORATORY PANEL:  CBC Recent Labs  Lab 02/14/24 0510  WBC 10.4  HGB 13.4  HCT 38.7*  PLT 319    Chemistries  Recent Labs  Lab 02/16/24 0407  NA 133*  K 4.4  CL 100  CO2 26  GLUCOSE 213*  BUN 17  CREATININE 0.48*  CALCIUM 8.4*  MG 2.2  AST 17  ALT 14  ALKPHOS 62  BILITOT 0.4    Assessment and Plan ALOYSUIS RIBAUDO is a 48 y.o. male with medical history significant of IIDM, diabetic neuropathy, status post Roux-en-Y gastric bypass, presented with severe sudden onset of abdominal pain.  Patient woke up this morning about 2 AM with severe sharp like abdominal pain 10/10,  all over belly, constant.   CTA chest abdomen pelvis showed signs of bowel perforation and free air in peritoneal cavity.   General surgery was consulted and patient was taken to the OR emergently s/p exploratory laparotomy with small bowel resection and feeding jejunostomy creation.   Bowel perforation (HCC) --Patient met severe sepsis criteria with tachycardia, leukocytosis and lactic acidosis, secondary to acute peritonitis from bowel rupture. --S/p exploratory laparotomy and small bowel resection with jejunostomy feeding feeding tube placement.  Preliminary blood cultures negative --General surgery is on board and managing --Upper GI  series and CT done yesterday was negative for any anastomosis leak -- IV Zosyn --last dose today -Follow recommendations by general surgery--now on TPN --NG removed, started CLD, had BM --now on soft diet and TPN being weans   Postoperative ileus (HCC) -Supportive care, Improving   Diabetes mellitus without complication (HCC) --CBG currently within goal, A1c of 7.2. --resumed home metformin  --Continue with SSI   Neuropathy --No acute concern. -on gabapentin  prn   Alcohol abuse - no s/o WD   COPD (chronic obstructive pulmonary disease) (HCC) --No acute concern. - As needed bronchodilator  Procedures: exploratory laparotomy with small bowel resection and feeding jejunostomy creation Family communication : none Consults : general surgery Dr. Rodolph CODE STATUS: full DVT Prophylaxis : Lovenox  Level of care: Telemetry Medical Status is: Inpatient Remains inpatient appropriate because: bowel perforation. Currently on TPN, now started TPN wean  Discharge date per Surgery recs    TOTAL TIME TAKING CARE OF THIS PATIENT: 35 minutes.  >50% time spent on counselling and coordination of care  Note: This dictation was prepared with Dragon dictation along with smaller phrase technology. Any transcriptional errors that result from this process are unintentional.  Leita Blanch M.D    [facility]   CC: Primary care physician; Practice, [facility]

## 2024-02-16 NOTE — Plan of Care (Signed)
  Problem: Fluid Volume: Goal: Ability to maintain a balanced intake and output will improve Outcome: Progressing   Problem: Health Behavior/Discharge Planning: Goal: Ability to identify and utilize available resources and services will improve Outcome: Progressing Goal: Ability to manage health-related needs will improve Outcome: Progressing   Problem: Metabolic: Goal: Ability to maintain appropriate glucose levels will improve Outcome: Progressing   Problem: Nutritional: Goal: Maintenance of adequate nutrition will improve Outcome: Progressing Goal: Progress toward achieving an optimal weight will improve Outcome: Progressing   Problem: Skin Integrity: Goal: Risk for impaired skin integrity will decrease Outcome: Progressing   Problem: Tissue Perfusion: Goal: Adequacy of tissue perfusion will improve Outcome: Progressing   Problem: Education: Goal: Knowledge of General Education information will improve Description: Including pain rating scale, medication(s)/side effects and non-pharmacologic comfort measures Outcome: Progressing   Problem: Health Behavior/Discharge Planning: Goal: Ability to manage health-related needs will improve Outcome: Progressing   Problem: Clinical Measurements: Goal: Will remain free from infection Outcome: Progressing Goal: Diagnostic test results will improve Outcome: Progressing Goal: Respiratory complications will improve Outcome: Progressing Goal: Cardiovascular complication will be avoided Outcome: Progressing   Problem: Activity: Goal: Risk for activity intolerance will decrease Outcome: Progressing   Problem: Nutrition: Goal: Adequate nutrition will be maintained Outcome: Progressing   Problem: Coping: Goal: Level of anxiety will decrease Outcome: Progressing

## 2024-02-16 NOTE — Progress Notes (Signed)
 [facility]- General Surgery  SURGICAL PROGRESS NOTE  Hospital Day(s): 10.   Post op day(s): 10 Days Post-Op.   Interval History:  Patient seen and examined, No acute events or new complaints overnight.  Patient reports doing well. Has been having bowel movements. Abdominal discomfort is minimal. He is tolerating full liquid diet. Denies any nausea or vomiting.    Vital signs in last 24 hours: [min-max] current  Temp:  [97.8 F (36.6 C)-98.6 F (37 C)] 98.1 F (36.7 C) (07/24 0728) Pulse Rate:  [76-93] 76 (07/24 0728) Resp:  [16-19] 16 (07/24 0728) BP: (113-115)/(71-75) 115/71 (07/24 0728) SpO2:  [99 %-100 %] 99 % (07/24 0728) Weight:  [58.8 kg] 58.8 kg (07/24 0457)     Height: 5' 9 (175.3 cm) Weight: 58.8 kg BMI (Calculated): 19.13   Intake/Output last 2 shifts:  07/23 0701 - 07/24 0700 In: 685 [P.O.:505; NG/GT:180] Out: -    Physical Exam:  Constitutional: alert, cooperative and no distress  Respiratory: breathing non-labored at rest  Cardiovascular: regular rate and sinus rhythm  Gastrointestinal: soft, non-tender, and non-distended, skin staples intact with J-tube in place   Labs:     Latest Ref Rng & Units 02/14/2024    5:10 AM 02/11/2024    5:38 AM 02/07/2024    4:27 AM  CBC  WBC 4.0 - 10.5 K/uL 10.4  8.8  12.7   Hemoglobin 13.0 - 17.0 g/dL 86.5  87.7  86.5   Hematocrit 39.0 - 52.0 % 38.7  35.3  38.4   Platelets 150 - 400 K/uL 319  216  218       Latest Ref Rng & Units 02/16/2024    4:07 AM 02/15/2024    4:45 AM 02/13/2024    3:56 AM  CMP  Glucose 70 - 99 mg/dL 786  766  805   BUN 6 - 20 mg/dL 17  18  13    Creatinine 0.61 - 1.24 mg/dL 9.51  9.43  9.47   Sodium 135 - 145 mmol/L 133  130  132   Potassium 3.5 - 5.1 mmol/L 4.4  4.7  4.3   Chloride 98 - 111 mmol/L 100  98  97   CO2 22 - 32 mmol/L 26  26  25    Calcium 8.9 - 10.3 mg/dL 8.4  8.5  8.6   Total Protein 6.5 - 8.1 g/dL 5.5   5.7   Total Bilirubin 0.0 - 1.2 mg/dL 0.4   0.5   Alkaline Phos 38 -  126 U/L 62   56   AST 15 - 41 U/L 17   14   ALT 0 - 44 U/L 14   14      Imaging studies: No new pertinent imaging studies   Assessment/Plan:  48 y.o. male with perforation of intestine 10 Days Post-Op s/p for exploratory laparotomy with small bowel resection and feeding jejunostomy creation, complicated by pertinent comorbidities including alcohol use disorder, diabetes, and neuropathy.    - Post-ileus day 8, seems to be resolving    - Stable vital signs    - Tolerated full liquid diet, advance to soft diet today  - Taper off TPN   - Discontinue enteral feeds via J-tube   - Completed course of IV zosyn    - Continue MiraLAX   - Continue pain management and DVT prophylaxis    -- Tenika Keeran Barrientos PA-C

## 2024-02-16 NOTE — Consult Note (Addendum)
 PHARMACY - TOTAL PARENTERAL NUTRITION CONSULT NOTE   Indication: Prolonged ileus  Patient Measurements: Height: 5' 9 (175.3 cm) Weight: 58.8 kg (129 lb 10.1 oz) IBW/kg (Calculated) : 70.7 TPN AdjBW (KG): 65.8  Assessment:  IIDM, diabetic neuropathy, status post Roux-en-Y gastric bypass, and alcohol use disorder. Patient taken to OR on 7/14 for emergent surgery emergently s/p exploratory laparotomy with small bowel resection and feeding jejunostomy creation. 2ry to perforated intestine. Patient with prolonged post-op ileus and NPO for 4 days. Pharmacy consulted to start and manage TPN.  Glucose / Insulin : Diabetic on SSI and A1C 7.2%. BG 99-274. 19 units of insulin  in last 24 hours.  Provider resumed Metformin  1000 mg BID Reduced dextrose  from 15% to 13% 7/22 Electrolytes:  Na 133 Renal: Scr < 1 Hepatic: LFTs - WNL Intake / Output; MIVF: N/A GI Imaging: CT performed 7/17  Increased fluid-filled dilation of small-bowel loops, likely ileus. Bilateral pleural effusions, unchanged CT negative for anastomosis leak GI Surgeries / Procedures:   Central access: PICC line 7/18 TPN start date: 7/18  Nutritional Goals: Goal TPN rate is 90 mL/hr (provides 112.3 g of protein and 2052 kcals per day)  RD Assessment: Estimated Needs Total Energy Estimated Needs: 2000-2300kcal/day Total Protein Estimated Needs: 100-115g/day Total Fluid Estimated Needs: 2.0-2.3L/day  Current Nutrition: Soft Diet  Ok to cut TPN to half rate 7/24  Plan:  Wean TPN to 45 mL/hr at 1800.  Electrolytes in TPN: Na 108mEq/L (Increase to 60 mEq/L on 7/23), K 50 mEq/L (Decrease to 40 mEq/L 7/23), Ca 14mEq/L, Mg 10mEq/L, and Phos 15mmol/L. Cl:Ac 1:1 Add standard MVI and trace elements to TPN Add Zinc  5 mg to IV bag per dietitian recommendation Daily thiamine  outside bag already ordered by provider for alcohol withdrawal.  Change SSI to  Moderate q4h SSI and adjust as needed  Monitor TPN labs on Mon/Thurs, and  daily until stable.  Kayla Mose Blew III, PharmD, BCPS 02/16/2024 7:17 AM

## 2024-02-16 NOTE — Plan of Care (Signed)
   Problem: Education: Goal: Ability to describe self-care measures that may prevent or decrease complications (Diabetes Survival Skills Education) will improve Outcome: Progressing

## 2024-02-17 ENCOUNTER — Other Ambulatory Visit: Payer: Self-pay

## 2024-02-17 DIAGNOSIS — K631 Perforation of intestine (nontraumatic): Secondary | ICD-10-CM | POA: Diagnosis not present

## 2024-02-17 LAB — GLUCOSE, CAPILLARY
Glucose-Capillary: 174 mg/dL — ABNORMAL HIGH (ref 70–99)
Glucose-Capillary: 199 mg/dL — ABNORMAL HIGH (ref 70–99)
Glucose-Capillary: 124 mg/dL — ABNORMAL HIGH (ref 70–99)

## 2024-02-17 MED ORDER — FOLIC ACID 1 MG PO TABS
1.0000 mg | ORAL_TABLET | Freq: Every day | ORAL | 1 refills | Status: AC
  Filled 2024-02-17: qty 90, 90d supply, fill #0

## 2024-02-17 MED ORDER — VITAMIN A 3 MG (10000 UNIT) PO CAPS
10000.0000 [IU] | ORAL_CAPSULE | Freq: Every day | ORAL | 1 refills | Status: AC
Start: 2024-02-17 — End: ?
  Filled 2024-02-17: qty 30, 30d supply, fill #0

## 2024-02-17 MED ORDER — ZINC SULFATE 220 (50 ZN) MG PO TABS
220.0000 mg | ORAL_TABLET | Freq: Every day | ORAL | 0 refills | Status: AC
  Filled 2024-02-17: qty 100, 100d supply, fill #0

## 2024-02-17 MED ORDER — OXYCODONE-ACETAMINOPHEN 5-325 MG PO TABS
1.0000 | ORAL_TABLET | Freq: Four times a day (QID) | ORAL | 0 refills | Status: AC | PRN
  Filled 2024-02-17: qty 20, 3d supply, fill #0

## 2024-02-17 MED ORDER — NEPRO/CARBSTEADY PO LIQD
237.0000 mL | Freq: Three times a day (TID) | ORAL | 0 refills | Status: AC
Start: 2024-02-17 — End: ?
  Filled 2024-02-17: qty 237, 1d supply, fill #0

## 2024-02-17 MED ORDER — VITAMIN E 180 MG (400 UNIT) PO CAPS
400.0000 [IU] | ORAL_CAPSULE | Freq: Every day | ORAL | 1 refills | Status: AC
Start: 2024-02-17 — End: ?
  Filled 2024-02-17: qty 100, 100d supply, fill #0

## 2024-02-17 MED ORDER — FREE WATER
30.0000 mL | Freq: Two times a day (BID) | 1 refills | Status: AC
  Filled 2024-02-17: qty 500, 8d supply, fill #0

## 2024-02-17 MED ORDER — VITAMIN D (ERGOCALCIFEROL) 1.25 MG (50000 UNIT) PO CAPS
50000.0000 [IU] | ORAL_CAPSULE | ORAL | 1 refills | Status: AC
Start: 2024-02-24 — End: ?
  Filled 2024-02-17: qty 5, 35d supply, fill #0

## 2024-02-17 MED ORDER — VITAMIN B-1 100 MG PO TABS
100.0000 mg | ORAL_TABLET | Freq: Every day | ORAL | 1 refills | Status: AC
  Filled 2024-02-17: qty 90, 90d supply, fill #0

## 2024-02-17 NOTE — Discharge Summary (Signed)
 Physician Discharge Summary   Patient: Christian Mathis MRN: [id number] DOB: 12/04/1975  Admit date:     02/06/2024  Discharge date: 02/17/24  Discharge Physician: Leita Blanch   PCP: [facility]   Recommendations at discharge:    F/u Dr Rodolph on your scheduled appt Establish PCP in the area  Discharge Diagnoses: Principal Problem:   Bowel perforation (HCC) Active Problems:   Peritonitis (HCC)   History of Roux-en-Y gastric bypass   Postoperative ileus (HCC)   Diabetes mellitus without complication (HCC)   Neuropathy   Alcohol abuse   COPD (chronic obstructive pulmonary disease) (HCC)   Protein-calorie malnutrition, severe  Christian Mathis is a 48 y.o. male with medical history significant of IIDM, diabetic neuropathy, status post Roux-en-Y gastric bypass, presented with severe sudden onset of abdominal pain.  Patient woke up this morning about 2 AM with severe sharp like abdominal pain 10/10,  all over belly, constant.    CTA chest abdomen pelvis showed signs of bowel perforation and free air in peritoneal cavity.   General surgery was consulted and patient was taken to the OR emergently s/p exploratory laparotomy with small bowel resection and feeding jejunostomy creation.     Bowel perforation (HCC) --Patient met severe sepsis criteria with tachycardia, leukocytosis and lactic acidosis, secondary to acute peritonitis from bowel rupture. --S/p exploratory laparotomy and small bowel resection with jejunostomy feeding feeding tube placement.  Preliminary blood cultures negative --General surgery is on board and managing --Upper GI series and CT done yesterday was negative for any anastomosis leak -- IV Zosyn --last dose today -Follow recommendations by general surgery--now on TPN --NG removed, started CLD, had BM --now on soft diet and TPN d/ced --7/25--doing overall well. Ok to d/c from surgery standpoint.   Postoperative ileus  (HCC) -Supportive care, Improving   Diabetes mellitus without complication (HCC) --CBG currently within goal, A1c of 7.2. --resumed home metformin  --Continue with SSI   Neuropathy --No acute concern. -on gabapentin  prn   Alcohol abuse - no s/o WD   COPD (chronic obstructive pulmonary disease) (HCC) --No acute concern. - As needed bronchodilator  Nutrition Status: Nutrition Problem: Severe Malnutrition Etiology: chronic illness (roux-en-y, COPD, etoh abuse) Signs/Symptoms: severe fat depletion, severe muscle depletion  --po vitamin supplements rxed    D/c home with out pt f/u with gen surgery   Procedures: exploratory laparotomy with small bowel resection and feeding jejunostomy creation Family communication : none Consults : general surgery Dr. Rodolph CODE STATUS: full DVT Prophylaxis : Lovenox      Pain control - [place]  Controlled Substance Reporting System database was reviewed. and patient was instructed, not to drive, operate heavy machinery, perform activities at heights, swimming or participation in water  activities or provide baby-sitting services while on Pain, Sleep and Anxiety Medications; until their outpatient Physician has advised to do so again. Also recommended to not to take more than prescribed Pain, Sleep and Anxiety Medications.  Disposition: Home Diet recommendation:   DISCHARGE MEDICATION: Allergies as of 02/17/2024       Reactions   Ibuprofen Other (See Comments)   Was told not to take because of the bariatric surgery he had   Morphine And Codeine Other (See Comments)   Headache and burning in throat  Has had vicodin before w/o issues.        Medication List     TAKE these medications    feeding supplement (NEPRO CARB STEADY) Liqd Take 237 mLs by mouth 3 (three)  times daily between meals.   folic acid  1 MG tablet Commonly known as: FOLVITE  Take 1 tablet (1 mg total) by mouth daily.   free water  Soln Place 30 mLs  into feeding tube 2 (two) times daily.   gabapentin  400 MG capsule Commonly known as: NEURONTIN  Take 400 mg by mouth 4 (four) times daily as needed.   metFORMIN  1000 MG tablet Commonly known as: GLUCOPHAGE  Take 1 tablet by mouth 2 (two) times daily.   oxyCODONE -acetaminophen  5-325 MG tablet Commonly known as: PERCOCET/ROXICET Take 1-2 tablets by mouth every 6 (six) hours as needed for moderate pain (pain score 4-6).   thiamine  100 MG tablet Commonly known as: VITAMIN B1 Take 1 tablet (100 mg total) by mouth daily.   vitamin A  3 MG (10000 UNITS) capsule Take 1 capsule (10,000 Units total) by mouth daily.   Vitamin D  (Ergocalciferol ) 1.25 MG (50000 UNIT) Caps capsule Commonly known as: DRISDOL  Take 1 capsule (50,000 Units total) by mouth every 7 (seven) days. Start taking on: February 24, 2024   Vitamin E  180 MG (400 UNIT) Caps Take 1 capsule (400 Units total) by mouth daily.   Zinc  Sulfate 220 (50 Zn) MG Tabs Take 1 tablet (220 mg total) by mouth daily.        Follow-up Information     [facility]. Schedule an appointment as soon as possible for a visit in 1 week(s).   Why: call and make an appt. for 02/24/24 for a post hospital follow up Contact information: [address] [place] [address] [phone]         Rodolph Romano, MD. Schedule an appointment as soon as possible for a visit in 1 week(s).   Specialty: General Surgery Why: post op f/u  go to appt. on 02/24/24 @9 :45am Contact information: [address] [place] [address] [phone]                Discharge Exam: Filed Weights   02/13/24 0422 02/15/24 0341 02/16/24 0457  Weight: 57.4 kg 58.4 kg 58.8 kg    GENERAL:  47 y.o.-year-old patient with no acute distress.  LUNGS: Normal breath sounds bilaterally, no wheezing CARDIOVASCULAR: S1, S2 normal. No murmur   ABDOMEN: PEG +. Midline incision stable EXTREMITIES: No  edema  b/l.   NEUROLOGIC: nonfocal  patient is alert and awake Condition at discharge: fair  The results of significant diagnostics from this hospitalization (including imaging, microbiology, ancillary and laboratory) are listed below for reference.   Imaging Studies: DG Chest Port 1 View Result Date: 02/15/2024 CLINICAL DATA:  [id number] PICC (peripherally inserted central catheter) in place [id number] EXAM: PORTABLE CHEST 1 VIEW COMPARISON:  None Available. FINDINGS: Right PICC with tip overlying the expected region of the superior caval junction. The heart and mediastinal contours are within normal limits. No focal consolidation. No pulmonary edema. No pleural effusion. No pneumothorax. No acute osseous abnormality. IMPRESSION: 1. No active disease. 2. Right PICC in appropriate position. Electronically Signed   By: Morgane  Naveau M.D.   On: 02/15/2024 15:46   DG Abd 2 Views Result Date: 02/11/2024 CLINICAL DATA:  [id number] Ileus following gastrointestinal surgery [facility]) [id number] EXAM: ABDOMEN - 2 VIEW COMPARISON:  02/09/2024 FINDINGS: Visualized lung bases clear. Gastric tube has its proximal side hole in the distal esophagus. No free air. Stomach and small bowel appear decompressed. Residual contrast in the descending colon without dilatation. Surgical drain in the upper abdomen. Nonspecific multi sidehole catheter left  mid abdomen. Midline skin staples. No abnormal abdominal calcifications. Regional bones unremarkable. IMPRESSION: 1. Nonobstructive bowel gas pattern. 2. Gastric tube with proximal side hole in the distal esophagus. Electronically Signed   By: JONETTA Faes M.D.   On: 02/11/2024 13:44   CT ABDOMEN W CONTRAST Result Date: 02/09/2024 CLINICAL DATA:  Bowel perforation status post exploratory laparotomy and small-bowel resection. Acute onset severe abdominal pain and increased drain output. EXAM: CT ABDOMEN WITH CONTRAST TECHNIQUE: Multidetector CT imaging of the abdomen was performed using the standard  protocol following bolus administration of intravenous contrast. RADIATION DOSE REDUCTION: This exam was performed according to the departmental dose-optimization program which includes automated exposure control, adjustment of the mA and/or kV according to patient size and/or use of iterative reconstruction technique. CONTRAST:  OMNIPAQUE  IOHEXOL  300 MG/ML SOLN, OMNIPAQUE  IOHEXOL  300 MG/ML SOLN COMPARISON:  CT abdomen dated 02/08/2024, CTA abdomen and pelvis dated 02/06/2024 FINDINGS: Lower chest: No focal consolidation or pulmonary nodule in the lung bases. Bilateral pleural effusions, unchanged. Partially imaged heart size is normal. Hepatobiliary: No focal hepatic lesions. No intra or extrahepatic biliary ductal dilation. Cholecystectomy. Pancreas: No focal lesions or main ductal dilation. Spleen: Normal in size without focal abnormality. Adrenals/Urinary Tract: No adrenal nodules. No suspicious renal masses, calculi or hydronephrosis. Stomach/Bowel: Enteric tube terminates in the gastric pouch. Postsurgical changes of Roux-en-Y gastric bypass. High attenuation contrast material within the stomach and small bowel resultant beam hardening artifact and suboptimal evaluation of adjacent structures. The proximal post anastomotic small bowel is underdistended with apparent mural thickening. Enteric contrast reaches the ascending colon. Status post small bowel resection. Increased fluid-filled dilation of small-bowel loops. Vascular/Lymphatic: No significant vascular findings are present. No enlarged abdominal lymph nodes. Other: Right lateral approach drainage catheter tip terminates in the left upper quadrant. Decreased small volume intraperitoneal free air. Trace intraperitoneal free fluid. Musculoskeletal: No acute or abnormal lytic or blastic osseous lesions. Mild body wall edema. Postsurgical changes of the anterior abdominal wall. IMPRESSION: 1. Increased fluid-filled dilation of small-bowel loops,  likely ileus. 2. No extraluminal contrast leak. Underdistended proximal post anastomotic small bowel demonstrates mural thickening, which may be postsurgical. 3. Decreased small volume intraperitoneal free air. 4. Bilateral pleural effusions, unchanged. Electronically Signed   By: Limin  Xu M.D.   On: 02/09/2024 17:51   US  EKG SITE RITE Result Date: 02/09/2024 If Site Rite image not attached, placement could not be confirmed due to current cardiac rhythm.  DG Abd 1 View Result Date: 02/09/2024 CLINICAL DATA:  [id number] Encounter for imaging study to confirm nasogastric (NG) tube placement [id number] EXAM: ABDOMEN - 1 VIEW COMPARISON:  February 08, 2024 FINDINGS: Esophagogastric tube tip terminates in the region of the stomach and gastrojejunal anastomosis. Postsurgical drain across the epigastrium. Surgical skin staples. Enteric contrast in the right hemiabdomen bowel. Nonobstructive bowel gas pattern. No pneumoperitoneum. No organomegaly or radiopaque calculi. IMPRESSION: Similarly positioned esophagogastric tube terminating in the region of the stomach and gastrojejunal anastomosis. Electronically Signed   By: Rogelia Myers M.D.   On: 02/09/2024 16:34   CT ABDOMEN W CONTRAST Result Date: 02/08/2024 CLINICAL DATA:  Ulcer status post bypass EXAM: CT ABDOMEN WITH CONTRAST TECHNIQUE: Multidetector CT imaging of the abdomen was performed using the standard protocol following bolus administration of intravenous contrast. RADIATION DOSE REDUCTION: This exam was performed according to the departmental dose-optimization program which includes automated exposure control, adjustment of the mA and/or kV according to patient size and/or use of iterative reconstruction technique. CONTRAST:  OMNIPAQUE   IOHEXOL  300 MG/ML  SOLN COMPARISON:  UGI 02/08/2024 CT 02/06/2024 FINDINGS: Lower chest: Lung bases demonstrate small pleural effusions and dependent atelectasis. Hepatobiliary: Cholecystectomy.  No significant biliary  dilatation. Pancreas: No inflammation or ductal dilatation Spleen: Normal in size without focal abnormality. Adrenals/Urinary Tract: Adrenal glands are normal. Kidneys show no hydronephrosis. Stomach/Bowel: Enteric tube tip within left upper quadrant jejunum. Status post gastric bypass with interval jejunal resection and reanastomosis. Wall thickening involving the gastric pouch, gastrojejunostomy and jejunal limb suspected to be secondary to postoperative edema. No evidence for obstruction. Enteral contrast from previously performed UGI, contrast has reached left lower quadrant nondilated small bowel. No evidence for contrast extravasation to suggest a leak. Interim placement of left abdominal percutaneous jejunostomy tube with tip slightly to the right of midline. Interim placement of right-sided abdominal drainage catheter with the tip in the left upper quadrant. The colon is decompressed. Vascular/Lymphatic: Nonaneurysmal aorta.  No suspicious lymph nodes Other: Small foci of free air in the upper abdomen likely postoperative. Interval ventral incision with cutaneous staples Musculoskeletal: No acute osseous abnormality. IMPRESSION: 1. Status post gastric bypass with interval jejunal resection and anastomosis. Wall thickening involving the gastric pouch, gastrojejunostomy and jejunum suspected to be secondary to postoperative edema. No evidence for obstruction or contrast extravasation to suggest a leak. New left abdominal jejunostomy tube with the tip slightly to the right of midline. Negative for rim enhancing intra-abdominal collection to suggest an abscess. 2. Small foci of free air in the upper abdomen likely postoperative. 3. Small pleural effusions and dependent atelectasis. Electronically Signed   By: Luke Bun M.D.   On: 02/08/2024 16:00   DG UGI W SINGLE CM (SOL OR THIN BA) Result Date: 02/08/2024 CLINICAL DATA:  Patient with a history of Roux-en-Y gastric bypass now admitted with intestinal  perforation. He is status post exploratory laparotomy with small-bowel resection and feeding jejunostomy creation. Water -soluble contrast study ordered to assess anastomosis. EXAM: DG UGI W SINGLE CM TECHNIQUE: Single contrast examination was then performed using Omnipaque  300. This exam was performed by Warren Dais NP, and was supervised and interpreted by Dr. Vanice. FLUOROSCOPY: Radiation Exposure Index (as provided by the fluoroscopic device): 21.4 mGy Kerma COMPARISON:  CT abdomen 02/08/2024 FINDINGS: Approximately 75 mL omni 300 administered via NG tube. Contrast flowed rapidly from the stomach into the small bowel through the gastrojejunostomy anastomosis. No evidence for contrast leak. IMPRESSION: Expected postoperative findings. Negative for leakage or contrast extravasation. Electronically Signed   By: CHRISTELLA.  Shick M.D.   On: 02/08/2024 15:24   CT Angio Chest/Abd/Pel for Dissection W and/or Wo Contrast Result Date: 02/06/2024 CLINICAL DATA:  Severe upper abdominal pain, tachycardia, hypotension. Query pancreatitis or acute aortic syndrome. Possible sepsis. EXAM: CT ANGIOGRAPHY CHEST, ABDOMEN AND PELVIS TECHNIQUE: Non-contrast CT of the chest was initially obtained. Multidetector CT imaging through the chest, abdomen and pelvis was performed using the standard protocol during bolus administration of intravenous contrast. Multiplanar reconstructed images and MIPs were obtained and reviewed to evaluate the vascular anatomy. RADIATION DOSE REDUCTION: This exam was performed according to the departmental dose-optimization program which includes automated exposure control, adjustment of the mA and/or kV according to patient size and/or use of iterative reconstruction technique. CONTRAST:  OMNIPAQUE  IOHEXOL  350 MG/ML SOLN COMPARISON:  No prior cross-sectional imaging for comparison. Last chest series PA and lateral 02/21/2023, before that PA chest and rib films 05/30/2019. A portable chest and abdomen  x-ray are ordered but not yet performed. FINDINGS: CTA CHEST FINDINGS Cardiovascular: The  cardiac size is normal. No pericardial effusion. No coronary artery calcifications are seen. The aorta and great vessels are normal without visible plaque disease, aneurysm, stenosis or dissection. There is a normal variant 4 vessel aortic arch with arch origin of left vertebral artery. The pulmonary arteries are adequately seen at least to the segmental level with no central embolus on this nontailored study. Pulmonary veins are normal caliber. Mediastinum/Nodes: No enlarged mediastinal, hilar, or axillary lymph nodes. Thyroid gland, trachea, and esophagus demonstrate no significant findings. Lungs/Pleura: There are calcified granulomas in the right upper and middle lobes. There is diffuse bronchial thickening. There is no consolidation, effusion, nodules or pneumothorax. Musculoskeletal: There are degenerative disc changes and mild endplate irregularities in the lower thoracic spine with mild spondylosis. No acute or other significant osseous findings. The ribcage is intact. No chest wall mass. Review of the MIP images confirms the above findings. CTA ABDOMEN AND PELVIS FINDINGS VASCULAR Aorta: Normal. Celiac: Normal. SMA: Normal. Renals: Normal. IMA: Normal. Inflow: Patent without evidence of aneurysm, dissection, vasculitis or significant stenosis. There beginning calcific plaques in the common iliac and internal iliac arteries. The external iliac arteries are plaque free. Veins: No obvious venous abnormality within the limitations of this arterial phase study. Review of the MIP images confirms the above findings. NON-VASCULAR Hepatobiliary: The liver moderately steatotic, 17 cm length. No mass enhancement. Surgically absent gallbladder without bile duct dilatation. Pancreas: Unremarkable. No pancreatic ductal dilatation or surrounding inflammatory changes. Spleen: No mass.  No splenomegaly. Adrenals/Urinary Tract: There is  no adrenal mass. Exophytic low-density lesion extends off the lower lateral left kidney measuring 1.9 cm and 52 Hounsfield units, above the usual density of fluid. Follow-up ultrasound recommended to assess for a proteinaceous or hemorrhagic cyst or solid lesion. Index of suspicion is low given homogeneity but follow-up is needed. Remainder of the kidneys are unremarkable. There is no urinary stone or obstruction. The bladder is unremarkable. Stomach/Bowel: Remote Roux-en-Y gastric bypass, with small bowel to small bowel anastomosis in the anterior left mid abdomen. There is scattered free air the upper abdomen, which is almost all in the left upper abdomen with trace amount in the pre hepatic space to the left. Findings consistent with a perforated hollow viscus. I am not certain where this is coming from, but there is potential for a small rent in the dorsal wall of the first post anastomotic jejunal segment just distal to the gastric anastomosis, equivocally seen on series 5 axial 157-161. The small intestine is diffusely thickened, unknown if this is congestive thickening, diffuse enteritis or reactive thickening due to the leaked out fluid in the abdomen and pelvis. In the right mid abdomen in the subhepatic space, there is a 4 cm in length small-bowel intussusception on coronal series 8, image 67 and on series 5 axial images 205-223. There is no wall pneumatosis or upstream dilatation. The appendix is normal in caliber. There is no evidence of a small-bowel obstruction. Colon is mostly empty and contracted. The mesentery is diffusely edematous which could be due to peritonitis or congestion. Lymphatic: There is mesenteric root adenopathy, multiple lymph nodes from borderline size up to 1.1 cm in short axis. No pelvic adenopathy. Reproductive: Normal prostate. Other: In addition to the free air there is scattered free fluid in the abdomen, in the mesenteric folds and a small amount collecting in the pelvis. No  large drainable pocket. Musculoskeletal: Mild features of erosive sacroiliitis. No acute or other significant osseous findings. Review of the MIP images confirms  the above findings. IMPRESSION: 1. Scattered free air in the upper abdomen consistent with a perforated hollow viscus. I am not certain where this is coming from, but there is potential for a small rent in the dorsal wall of the first post anastomotic jejunal segment just distal to the gastric anastomosis. 2. Scattered free fluid in the abdomen and pelvis. No large drainable pocket. 3. Diffuse small bowel thickening which could be congestive thickening, diffuse enteritis or reactive thickening due to the leaked out fluid in the abdomen and pelvis. 4. 4 cm in length small-bowel intussusception in the right mid abdomen in the subhepatic space. No wall pneumatosis or upstream dilatation. 5. Mesenteric root adenopathy.  Follow-up recommended. 6. Diffuse mesenteric edema which could be congestive or peritonitis. 7. 1.9 cm exophytic low-density lesion off the lower lateral left kidney. Follow-up ultrasound recommended to assess for a proteinaceous or hemorrhagic cyst or solid lesion. Index of suspicion is low given homogeneity but follow-up is needed as it is indeterminate due to density. 8. No evidence of aortic aneurysm or dissection. Negative CTA except for minimal iliac calcific plaques. 9. No CT evidence of acute pancreatitis. 10. Diffuse bronchial thickening. 11. Remote Roux-en-Y gastric bypass. 12. Mild features of erosive sacroiliitis. 13. Critical Value/emergent results were called by telephone at the time of interpretation on 02/06/2024 at 7:08 am to provider [facility] , who verbally acknowledged these results. Electronically Signed   By: Francis Quam M.D.   On: 02/06/2024 07:42   DG Abd 2 Views Result Date: 02/06/2024 CLINICAL DATA:  Sepsis.  Evaluate for free air. EXAM: ABDOMEN - 2 VIEW COMPARISON:  None FINDINGS: Right upper quadrant  cholecystectomy clips. Contrast material is identified within the collecting systems, ureters and urinary bladder. Bowel gas pattern is nonspecific. No pathologic dilatation of the large or small bowel loops. Pneumoperitoneum on the CTA obtained at 6:51 a.m. is suboptimally visualized by plain film technique. Visualized osseous structures appear grossly intact. IMPRESSION: 1. Nonspecific bowel gas pattern. No signs of high-grade bowel obstruction. 2. Pneumoperitoneum on the CTA obtained at 6:51 AM is suboptimally visualized by plain film technique. Electronically Signed   By: Waddell Calk M.D.   On: 02/06/2024 07:26   DG Chest Port 1 View Result Date: 02/06/2024 CLINICAL DATA:  Questionable sepsis.  Evaluate for free air. EXAM: PORTABLE CHEST 1 VIEW COMPARISON:  02/21/2023 FINDINGS: The heart size and mediastinal contours are within normal limits. Both lungs are clear. The visualized skeletal structures are unremarkable. IMPRESSION: No active disease. Electronically Signed   By: Waddell Calk M.D.   On: 02/06/2024 07:22    Microbiology: Results for orders placed or performed during the hospital encounter of 02/06/24  Resp panel by RT-PCR (RSV, Flu A&B, Covid) Anterior Nasal Swab     Status: None   Collection Time: 02/06/24  5:59 AM   Specimen: Anterior Nasal Swab  Result Value Ref Range Status   SARS Coronavirus 2 by RT PCR NEGATIVE NEGATIVE Final    Comment: (NOTE) SARS-CoV-2 target nucleic acids are NOT DETECTED.  The SARS-CoV-2 RNA is generally detectable in upper respiratory specimens during the acute phase of infection. The lowest concentration of SARS-CoV-2 viral copies this assay can detect is 138 copies/mL. A negative result does not preclude SARS-Cov-2 infection and should not be used as the sole basis for treatment or other patient management decisions. A negative result may occur with  improper specimen collection/handling, submission of specimen other than nasopharyngeal swab,  presence of viral mutation(s) within the  areas targeted by this assay, and inadequate number of viral copies(<138 copies/mL). A negative result must be combined with clinical observations, patient history, and epidemiological information. The expected result is Negative.  Fact Sheet for Patients:  BloggerCourse.com  Fact Sheet for Healthcare Providers:  SeriousBroker.it  This test is no t yet approved or cleared by the [place]  FDA and  has been authorized for detection and/or diagnosis of SARS-CoV-2 by FDA under an Emergency Use Authorization (EUA). This EUA will remain  in effect (meaning this test can be used) for the duration of the COVID-19 declaration under Section 564(b)(1) of the Act, 21 U.S.C.section 360bbb-3(b)(1), unless the authorization is terminated  or revoked sooner.       Influenza A by PCR NEGATIVE NEGATIVE Final   Influenza B by PCR NEGATIVE NEGATIVE Final    Comment: (NOTE) The Xpert Xpress SARS-CoV-2/FLU/RSV plus assay is intended as an aid in the diagnosis of influenza from Nasopharyngeal swab specimens and should not be used as a sole basis for treatment. Nasal washings and aspirates are unacceptable for Xpert Xpress SARS-CoV-2/FLU/RSV testing.  Fact Sheet for Patients: BloggerCourse.com  Fact Sheet for Healthcare Providers: SeriousBroker.it  This test is not yet approved or cleared by the [place]  FDA and has been authorized for detection and/or diagnosis of SARS-CoV-2 by FDA under an Emergency Use Authorization (EUA). This EUA will remain in effect (meaning this test can be used) for the duration of the COVID-19 declaration under Section 564(b)(1) of the Act, 21 U.S.C. section 360bbb-3(b)(1), unless the authorization is terminated or revoked.     Resp Syncytial Virus by PCR NEGATIVE NEGATIVE Final    Comment: (NOTE) Fact Sheet for  Patients: BloggerCourse.com  Fact Sheet for Healthcare Providers: SeriousBroker.it  This test is not yet approved or cleared by the [place]  FDA and has been authorized for detection and/or diagnosis of SARS-CoV-2 by FDA under an Emergency Use Authorization (EUA). This EUA will remain in effect (meaning this test can be used) for the duration of the COVID-19 declaration under Section 564(b)(1) of the Act, 21 U.S.C. section 360bbb-3(b)(1), unless the authorization is terminated or revoked.  Performed at [facility], [address] [place], [place] [address]   Blood Culture (routine x 2)     Status: None   Collection Time: 02/06/24  5:59 AM   Specimen: BLOOD  Result Value Ref Range Status   Specimen Description BLOOD RIGHT ARM  Final   Special Requests   Final    BOTTLES DRAWN AEROBIC AND ANAEROBIC Blood Culture results may not be optimal due to an inadequate volume of blood received in culture bottles   Culture   Final    NO GROWTH 5 DAYS Performed at [facility], [address] [place], [place] [address]    Report Status 02/11/2024 FINAL  Final  Blood Culture (routine x 2)     Status: None   Collection Time: 02/06/24  6:26 AM   Specimen: BLOOD  Result Value Ref Range Status   Specimen Description BLOOD BLOOD LEFT ARM  Final   Special Requests   Final    BOTTLES DRAWN AEROBIC AND ANAEROBIC Blood Culture results may not be optimal due to an inadequate volume of blood received in culture bottles   Culture   Final    NO GROWTH 5 DAYS Performed at [facility], [address], [place], [place] [address]    Report Status 02/11/2024 FINAL  Final    Labs: CBC: Recent Labs  Lab 02/11/24 0538 02/14/24 0510  WBC 8.8 10.4  HGB 12.2* 13.4  HCT 35.3* 38.7*  MCV 87.8 87.2  PLT 216 319   Basic Metabolic Panel: Recent Labs  Lab 02/11/24 0538 02/12/24 0535 02/13/24 0356 02/14/24 0510  02/15/24 0445 02/16/24 0407  NA 137 132* 132*  --  130* 133*  K 3.0* 3.7 4.3  --  4.7 4.4  CL 100 99 97*  --  98 100  CO2 31 27 25   --  26 26  GLUCOSE 134* 206* 194*  --  233* 213*  BUN 7 9 13   --  18 17  CREATININE 0.55* 0.58* 0.52*  --  0.56* 0.48*  CALCIUM 8.3* 8.6* 8.6*  --  8.5* 8.4*  MG 1.8 1.6* 1.9 1.9 2.1 2.2  PHOS 3.5 2.9 3.5  --  3.2 3.0   Liver Function Tests: Recent Labs  Lab 02/12/24 0535 02/13/24 0356 02/16/24 0407  AST  --  14* 17  ALT  --  14 14  ALKPHOS  --  56 62  BILITOT  --  0.5 0.4  PROT  --  5.7* 5.5*  ALBUMIN  2.6* 2.7* 2.5*   CBG: Recent Labs  Lab 02/16/24 2016 02/16/24 2330 02/17/24 0419 02/17/24 0926 02/17/24 1148  GLUCAP 208* 122* 174* 199* 124*    Discharge time spent: greater than 30 minutes.  Signed: Leita Blanch, MD [facility] 02/17/2024

## 2024-02-17 NOTE — Progress Notes (Signed)
 [facility]- General Surgery  SURGICAL PROGRESS NOTE  Hospital Day(s): 11.   Post op day(s): 11 Days Post-Op.   Interval History:  Patient seen and examined. No acute events or new complaints overnight.  Patient doing well overall. Still having bowel movements, no blood noted in the stool.  States he is tolerating soft diet. Only feels nauseous with sweets.  No emesis episodes reported.  Having more abdominal discomfort this morning. States he is ambulating with no issues.    Vital signs in last 24 hours: [min-max] current  Temp:  [97.6 F (36.4 C)-97.8 F (36.6 C)] 97.7 F (36.5 C) (07/25 0847) Pulse Rate:  [79-107] 79 (07/25 0847) Resp:  [16-17] 16 (07/25 0847) BP: (111-121)/(71-78) 116/71 (07/25 0847) SpO2:  [99 %-100 %] 100 % (07/25 0847)     Height: 5' 9 (175.3 cm) Weight: 58.8 kg BMI (Calculated): 19.13   Intake/Output last 2 shifts:  07/24 0701 - 07/25 0700 In: 4188 [P.O.:600; I.V.:1924.3; NG/GT:844.7; IV Piggyback:819.1] Out: -    Physical Exam:  Constitutional: alert, cooperative and no distress  Respiratory: breathing non-labored at rest  Cardiovascular: regular rate and sinus rhythm  Gastrointestinal: soft, mildly tender, and non-distended, staples intact,incision looks clean and dry, J-tube in place  Labs:     Latest Ref Rng & Units 02/14/2024    5:10 AM 02/11/2024    5:38 AM 02/07/2024    4:27 AM  CBC  WBC 4.0 - 10.5 K/uL 10.4  8.8  12.7   Hemoglobin 13.0 - 17.0 g/dL 86.5  87.7  86.5   Hematocrit 39.0 - 52.0 % 38.7  35.3  38.4   Platelets 150 - 400 K/uL 319  216  218       Latest Ref Rng & Units 02/16/2024    4:07 AM 02/15/2024    4:45 AM 02/13/2024    3:56 AM  CMP  Glucose 70 - 99 mg/dL 786  766  805   BUN 6 - 20 mg/dL 17  18  13    Creatinine 0.61 - 1.24 mg/dL 9.51  9.43  9.47   Sodium 135 - 145 mmol/L 133  130  132   Potassium 3.5 - 5.1 mmol/L 4.4  4.7  4.3   Chloride 98 - 111 mmol/L 100  98  97   CO2 22 - 32 mmol/L 26  26  25    Calcium 8.9 -  10.3 mg/dL 8.4  8.5  8.6   Total Protein 6.5 - 8.1 g/dL 5.5   5.7   Total Bilirubin 0.0 - 1.2 mg/dL 0.4   0.5   Alkaline Phos 38 - 126 U/L 62   56   AST 15 - 41 U/L 17   14   ALT 0 - 44 U/L 14   14     Imaging studies: No new pertinent imaging studies   Assessment/Plan:  48 y.o. male with perforation of intestine  11 Days Post-Op s/p for exploratory laparotomy with small bowel resection and feeding jejunostomy creation, complicated by pertinent comorbidities including alcohol use disorder, diabetes, and neuropathy.   Pot-ileus day 9   - Tolerating soft diet  - Off enteral feeds and tapering off TPN  - Continue MiraLAX   - Plan to remove skin staples later today  - Continue pain management and DVT prophylaxis  - Continue to ambulate  Discussed possible discharge tomorrow if patient continues to tolerate soft diet, pain controlled, ambulating, and continues to have bowel function  -- [employer] PA-C

## 2024-02-17 NOTE — Progress Notes (Signed)
 Nutrition Brief Note   48 y/o male with h/o DM, etoh abuse, GERD, MDD, morbid obesity s/p rouex-en-y gastric bypass (2014), COPD, OSA, neuropathy and chronic pancreatitis who is admitted with small bowel perforation now s/p exploratory laparotomy with small bowel resection & feeding jejunostomy creation 7/14 complicated by post op ileus.   Met with pt in room today. Pt reports that he is feeling much better. Tube feeds and TPN are discontinued. Plan is for discharge today. Pt with numerous vitamin deficiencies and will need supplementation and follow up after discharge; recommendations discussed with patient today.   Recommended Vitamin Supplementation:  -Bariatric multivitamin daily or two adult multivitamins daily for life -Calcium citrate 500mg  twice daily for life  -Ergocalciferol  (vitamin D ) 50,000 units once weekly for 6 weeks  -Vitamin A  10,000 units daily for 30 days   -Vitamin E  400 units daily for 30 days   -Zinc  50mg  daily for 30 days  -Folic acid  1mg  daily for 30 days   -Recommend diabetic friendly protein shakes 2-3 times per day  -Recommend having vitamin labs rechecked by PCP in 30-60 days after supplementation complete. Once vitamin levels restored to normal recommended checking vitamin labs yearly as recommended by the [employer] for [employer].    [email or web address]  Augustin Shams MS, RD, LDN If unable to be reached, please send secure chat to RD inpatient available from 8:00a-4:00p daily

## 2024-02-17 NOTE — Progress Notes (Signed)
 PICC of RUA removed. Tolerated well no s/s of distress. pressure applied x3 minutes compression dressing applied and left in place. Pt education provided to continue limited limb use site care and in the event of uncontrolled bleeding. Pt education on jtube care and daily flushing. Pt able to verbalize back and return demonstrate proper flushing of Jtube. Supplies provided for flushing and gtube site care.

## 2024-02-17 NOTE — TOC Progression Note (Signed)
 Transition of Care [facility]) - Progression Note    Patient Details  Name: LAFAYETTE DUNLEVY MRN: [id number] Date of Birth: 1975-07-30  Transition of [facility]) CM/SW Contact  [place] L Darnesha [place], [place] Phone Number: 02/17/2024, 9:54 AM  Clinical Narrative:     CSW added resources for PCP to the AVS for patient.    Expected Discharge Plan: Home/Self Care Barriers to Discharge: Continued Medical Work up               Expected Discharge Plan and Services     Post Acute Care Choice: NA Living arrangements for the past 2 months: Single Family Home                                       Social Drivers of Health (SDOH) Interventions SDOH Screenings   Food Insecurity: No Food Insecurity (02/06/2024)  Housing: Low Risk  (02/06/2024)  Transportation Needs: No Transportation Needs (02/06/2024)  Utilities: Not At Risk (02/06/2024)  Social Connections: Unknown (02/06/2024)  Tobacco Use: High Risk (02/06/2024)    Readmission Risk Interventions     No data to display

## 2024-04-03 ENCOUNTER — Other Ambulatory Visit: Payer: Self-pay

## 2024-04-03 DIAGNOSIS — F17200 Nicotine dependence, unspecified, uncomplicated: Secondary | ICD-10-CM
